# Patient Record
Sex: Female | Born: 1960 | Race: White | Hispanic: No | Smoking: Never smoker
Health system: Southern US, Community
[De-identification: ages and names within clinical notes are randomized; demographics above are authoritative.]

## PROBLEM LIST (undated history)

## (undated) HISTORY — PX: WISDOM TOOTH EXTRACTION: SHX21

## (undated) HISTORY — PX: OTHER SURGICAL HISTORY: SHX169

---

## 1998-09-14 ENCOUNTER — Encounter: Payer: Self-pay | Admitting: *Deleted

## 1998-09-14 ENCOUNTER — Ambulatory Visit (HOSPITAL_COMMUNITY): Admission: RE | Admit: 1998-09-14 | Discharge: 1998-09-14 | Payer: Self-pay | Admitting: *Deleted

## 1999-09-17 ENCOUNTER — Ambulatory Visit (HOSPITAL_BASED_OUTPATIENT_CLINIC_OR_DEPARTMENT_OTHER): Admission: RE | Admit: 1999-09-17 | Discharge: 1999-09-17 | Payer: Self-pay | Admitting: Orthopaedic Surgery

## 2001-02-22 ENCOUNTER — Inpatient Hospital Stay (HOSPITAL_COMMUNITY): Admission: AD | Admit: 2001-02-22 | Discharge: 2001-02-25 | Payer: Self-pay | Admitting: Obstetrics and Gynecology

## 2001-02-27 ENCOUNTER — Emergency Department (HOSPITAL_COMMUNITY): Admission: EM | Admit: 2001-02-27 | Discharge: 2001-02-27 | Payer: Self-pay | Admitting: Emergency Medicine

## 2001-03-24 ENCOUNTER — Other Ambulatory Visit: Admission: RE | Admit: 2001-03-24 | Discharge: 2001-03-24 | Payer: Self-pay | Admitting: Obstetrics and Gynecology

## 2002-08-12 ENCOUNTER — Ambulatory Visit (HOSPITAL_COMMUNITY): Admission: RE | Admit: 2002-08-12 | Discharge: 2002-08-12 | Payer: Self-pay | Admitting: Obstetrics and Gynecology

## 2002-08-12 ENCOUNTER — Encounter: Payer: Self-pay | Admitting: Obstetrics and Gynecology

## 2002-12-02 ENCOUNTER — Other Ambulatory Visit: Admission: RE | Admit: 2002-12-02 | Discharge: 2002-12-02 | Payer: Self-pay | Admitting: Obstetrics and Gynecology

## 2004-03-06 ENCOUNTER — Ambulatory Visit (HOSPITAL_COMMUNITY): Admission: RE | Admit: 2004-03-06 | Discharge: 2004-03-06 | Payer: Self-pay | Admitting: Obstetrics and Gynecology

## 2004-05-03 ENCOUNTER — Ambulatory Visit: Payer: Self-pay | Admitting: Family Medicine

## 2004-12-30 ENCOUNTER — Other Ambulatory Visit: Admission: RE | Admit: 2004-12-30 | Discharge: 2004-12-30 | Payer: Self-pay | Admitting: Obstetrics & Gynecology

## 2005-05-13 ENCOUNTER — Ambulatory Visit (HOSPITAL_COMMUNITY): Admission: RE | Admit: 2005-05-13 | Discharge: 2005-05-13 | Payer: Self-pay | Admitting: Obstetrics & Gynecology

## 2006-08-04 ENCOUNTER — Ambulatory Visit (HOSPITAL_COMMUNITY): Admission: RE | Admit: 2006-08-04 | Discharge: 2006-08-04 | Payer: Self-pay | Admitting: Obstetrics & Gynecology

## 2007-09-01 ENCOUNTER — Ambulatory Visit (HOSPITAL_COMMUNITY): Admission: RE | Admit: 2007-09-01 | Discharge: 2007-09-01 | Payer: Self-pay | Admitting: Obstetrics & Gynecology

## 2009-08-21 ENCOUNTER — Ambulatory Visit (HOSPITAL_COMMUNITY): Admission: RE | Admit: 2009-08-21 | Discharge: 2009-08-21 | Payer: Self-pay | Admitting: Obstetrics & Gynecology

## 2009-08-23 ENCOUNTER — Emergency Department (HOSPITAL_BASED_OUTPATIENT_CLINIC_OR_DEPARTMENT_OTHER): Admission: EM | Admit: 2009-08-23 | Discharge: 2009-08-23 | Payer: Self-pay | Admitting: Emergency Medicine

## 2010-03-02 ENCOUNTER — Encounter: Payer: Self-pay | Admitting: Family Medicine

## 2010-06-28 NOTE — Op Note (Signed)
[place]  Patient:    Amber Pennington, Amber Pennington                      MRN: [id number] Proc. Date: 09/17/99 Adm. Date:  [id number] Attending:  Marcene Corning                           Operative Report  PREOPERATIVE DIAGNOSIS:  Right foot degenerative arthritis.  POSTOPERATIVE DIAGNOSIS:  Right foot degenerative arthritis.  PROCEDURE:  Right foot cheilectomy.  ANESTHESIA:  Ankle block.  SURGEON:  Lubertha Basque. Jerl Santos, M.D.  ASSISTANT:  Prince Rome, P.A.  INDICATIONS FOR PROCEDURE:  The patient is a 50 year old woman with a long history of bilateral pain at the first MTP joint.  This was worse on the right.  On x-ray, she had dorsal spurring on the metatarsal head and the base of the proximal phalanx.  At this point, she is offered a cheilectomy as she failed conservative measures, oral anti-inflammatories, and orthotics.  The procedure was discussed with the patient and an informed operative consent was obtained after discussion of possible complications of reaction to anesthesia and infection.  DESCRIPTION OF PROCEDURE:  The patient was taken to the operating suite where ankle block anesthetic was applied without difficulty.  She was positioned supine, and prepped and draped in a normal sterile fashion.  The right leg was elevated, exsanguinated, and the tourniquet inflated up the calf.  A small dorsal incision was made with dissection down to the first MTP joint.  The extensor tendon was taken in a lateral direction and a capsulotomy was performed.  The spur on the dorsal aspect of the metatarsal head was removed with an oscillating saw, taking about a quarter of the articular surface.  The ________ on the base of the proximal phalanx.  Fluoroscopy was used to confirm adequate resection of the spur.  The articular cartilage actually appeared fairly benign.  The joint was thoroughly irrigated followed by reapproximation of the capsule incision  with Vicryl.  The skin was then reapproximated with nylon in vertical mattresses in an interrupted fashion. Adaptic was placed on the wound followed by a dry gauze and loose Ace wrap. I used fluoroscopy throughout the case to make appropriate intraoperative incisions ________.  DISPOSITION:  The patient was taken to the recovery room in stable condition. Plans were for her to go home the same day and follow up in the office in less than a week.  I will contact her by phone tonight. DD:  09/17/99 TD:  09/18/99 Job: [id number]

## 2015-01-25 ENCOUNTER — Encounter: Payer: Self-pay | Admitting: Obstetrics & Gynecology

## 2015-01-25 ENCOUNTER — Ambulatory Visit (INDEPENDENT_AMBULATORY_CARE_PROVIDER_SITE_OTHER): Payer: 59 | Admitting: Obstetrics & Gynecology

## 2015-01-25 VITALS — BP 117/63 | HR 75 | Resp 16 | Ht 69.0 in | Wt 158.0 lb

## 2015-01-25 DIAGNOSIS — Z01419 Encounter for gynecological examination (general) (routine) without abnormal findings: Secondary | ICD-10-CM

## 2015-01-25 DIAGNOSIS — Z124 Encounter for screening for malignant neoplasm of cervix: Secondary | ICD-10-CM

## 2015-01-25 DIAGNOSIS — Z1231 Encounter for screening mammogram for malignant neoplasm of breast: Secondary | ICD-10-CM

## 2015-01-25 DIAGNOSIS — Z1151 Encounter for screening for human papillomavirus (HPV): Secondary | ICD-10-CM | POA: Diagnosis not present

## 2015-01-25 NOTE — Patient Instructions (Signed)
Return to clinic for any scheduled appointments or for any gynecologic concerns as needed.   

## 2015-01-25 NOTE — Progress Notes (Signed)
GYNECOLOGY CLINIC ANNUAL PREVENTATIVE CARE ENCOUNTER NOTE  Subjective:   Amber Pennington is a 54 y.o. [phone] [id number] female here for a routine annual gynecologic exam.  Current complaints: none.  Has not seen a gynecologist in over five years.  Reports having a "bladder bulge", does not cause her any problems, does not want intervention.  Denies abnormal vaginal bleeding, discharge, pelvic pain, problems with intercourse or other gynecologic concerns.    Gynecologic History No LMP recorded. Patient is postmenopausal. Contraception: none, in same-sex relationship Last Pap: 5 years ago. Results were: normal Last mammogram: 5 years ago. Results were: normal  Obstetric History OB History  Gravida Para Term Preterm AB SAB TAB Ectopic Multiple Living  4 2 2  2 2    2     # Outcome Date GA Lbr Len/2nd Weight Sex Delivery Anes PTL Lv  4 SAB           3 SAB           2 Term           1 Term               History reviewed. No pertinent past medical history.  Past Surgical History  Procedure Laterality Date  . Bone spur Right   . Wisdom tooth extraction      No current outpatient prescriptions on file prior to visit.   No current facility-administered medications on file prior to visit.    No Known Allergies  Social History   Social History  . Marital Status: Unknown    Spouse Name: N/A  . Number of Children: N/A  . Years of Education: N/A   Occupational History  . valet    Social History Main Topics  . Smoking status: Never Smoker   . Smokeless tobacco: Never Used  . Alcohol Use: 0.0 oz/week    0 Standard drinks or equivalent per week     Comment: wine daily  . Drug Use: No  . Sexual Activity:    Partners: Female   Other Topics Concern  . Not on file   Social History Narrative    Family History  Problem Relation Age of Onset  . Hodgkin's lymphoma Mother   . Hypertension Father     The following portions of the patient's history were reviewed and updated as  appropriate: allergies, current medications, past family history, past medical history, past social history, past surgical history and problem list.  Review of Systems Pertinent items noted in HPI and remainder of comprehensive ROS otherwise negative.   Objective:  BP 117/63 mmHg  Pulse 75  Resp 16  Ht 5\' 9"  (1.753 m)  Wt 158 lb (71.668 kg)  BMI 23.32 kg/m2 CONSTITUTIONAL: Well-developed, well-nourished female in no acute distress.  HENT:  Normocephalic, atraumatic, External right and left ear normal. Oropharynx is clear and moist EYES: Conjunctivae and EOM are normal. Pupils are equal, round, and reactive to light. No scleral icterus.  NECK: Normal range of motion, supple, no masses.  Normal thyroid.  SKIN: Skin is warm and dry. No rash noted. Not diaphoretic. No erythema. No pallor. NEUROLGIC: Alert and oriented to person, place, and time. Normal reflexes, muscle tone coordination. No cranial nerve deficit noted. PSYCHIATRIC: Normal mood and affect. Normal behavior. Normal judgment and thought content. CARDIOVASCULAR: Normal heart rate noted, regular rhythm RESPIRATORY: Clear to auscultation bilaterally. Effort and breath sounds normal, no problems with respiration noted. BREASTS: Symmetric in size. No masses, skin changes, nipple drainage,  or lymphadenopathy. ABDOMEN: Soft, normal bowel sounds, no distention noted.  No tenderness, rebound or guarding.  PELVIC: Normal appearing external genitalia; normal appearing vaginal mucosa and cervix. Stage 1 cystocele noted, goes to Stage 2 with Valsalva.  No abnormal discharge noted.  Pap smear obtained.  Normal uterine size, no other palpable masses, no uterine or adnexal tenderness. MUSCULOSKELETAL: Normal range of motion. No tenderness.  No cyanosis, clubbing, or edema.  2+ distal pulses.   Assessment:  Annual gynecologic examination with pap smear   Plan:  Will follow up results of pap smear and manage accordingly.  Mammogram  scheduled Routine preventative health maintenance measures emphasized. Please refer to After Visit Summary for other counseling recommendations.    Jaynie Collins, MD, FACOG Attending Obstetrician & Gynecologist, [facility] for [facility]

## 2015-01-29 LAB — CYTOLOGY - PAP

## 2015-09-06 ENCOUNTER — Encounter: Payer: Self-pay | Admitting: Radiology

## 2015-09-06 ENCOUNTER — Other Ambulatory Visit: Payer: Self-pay | Admitting: Occupational Medicine

## 2015-09-06 ENCOUNTER — Ambulatory Visit: Payer: Self-pay

## 2015-09-06 DIAGNOSIS — M79671 Pain in right foot: Secondary | ICD-10-CM

## 2015-09-06 DIAGNOSIS — M25571 Pain in right ankle and joints of right foot: Secondary | ICD-10-CM

## 2016-11-26 IMAGING — CR DG FOOT COMPLETE 3+V*R*
3 series · 3 of 3 positions shown · non-contrast
Comparison: None.

CLINICAL DATA: Twisted foot and ankle 08/28/2015. Persistent
lateral pain.

EXAM:
RIGHT FOOT COMPLETE - 3+ VIEW

[view not recorded (1 of 3)]
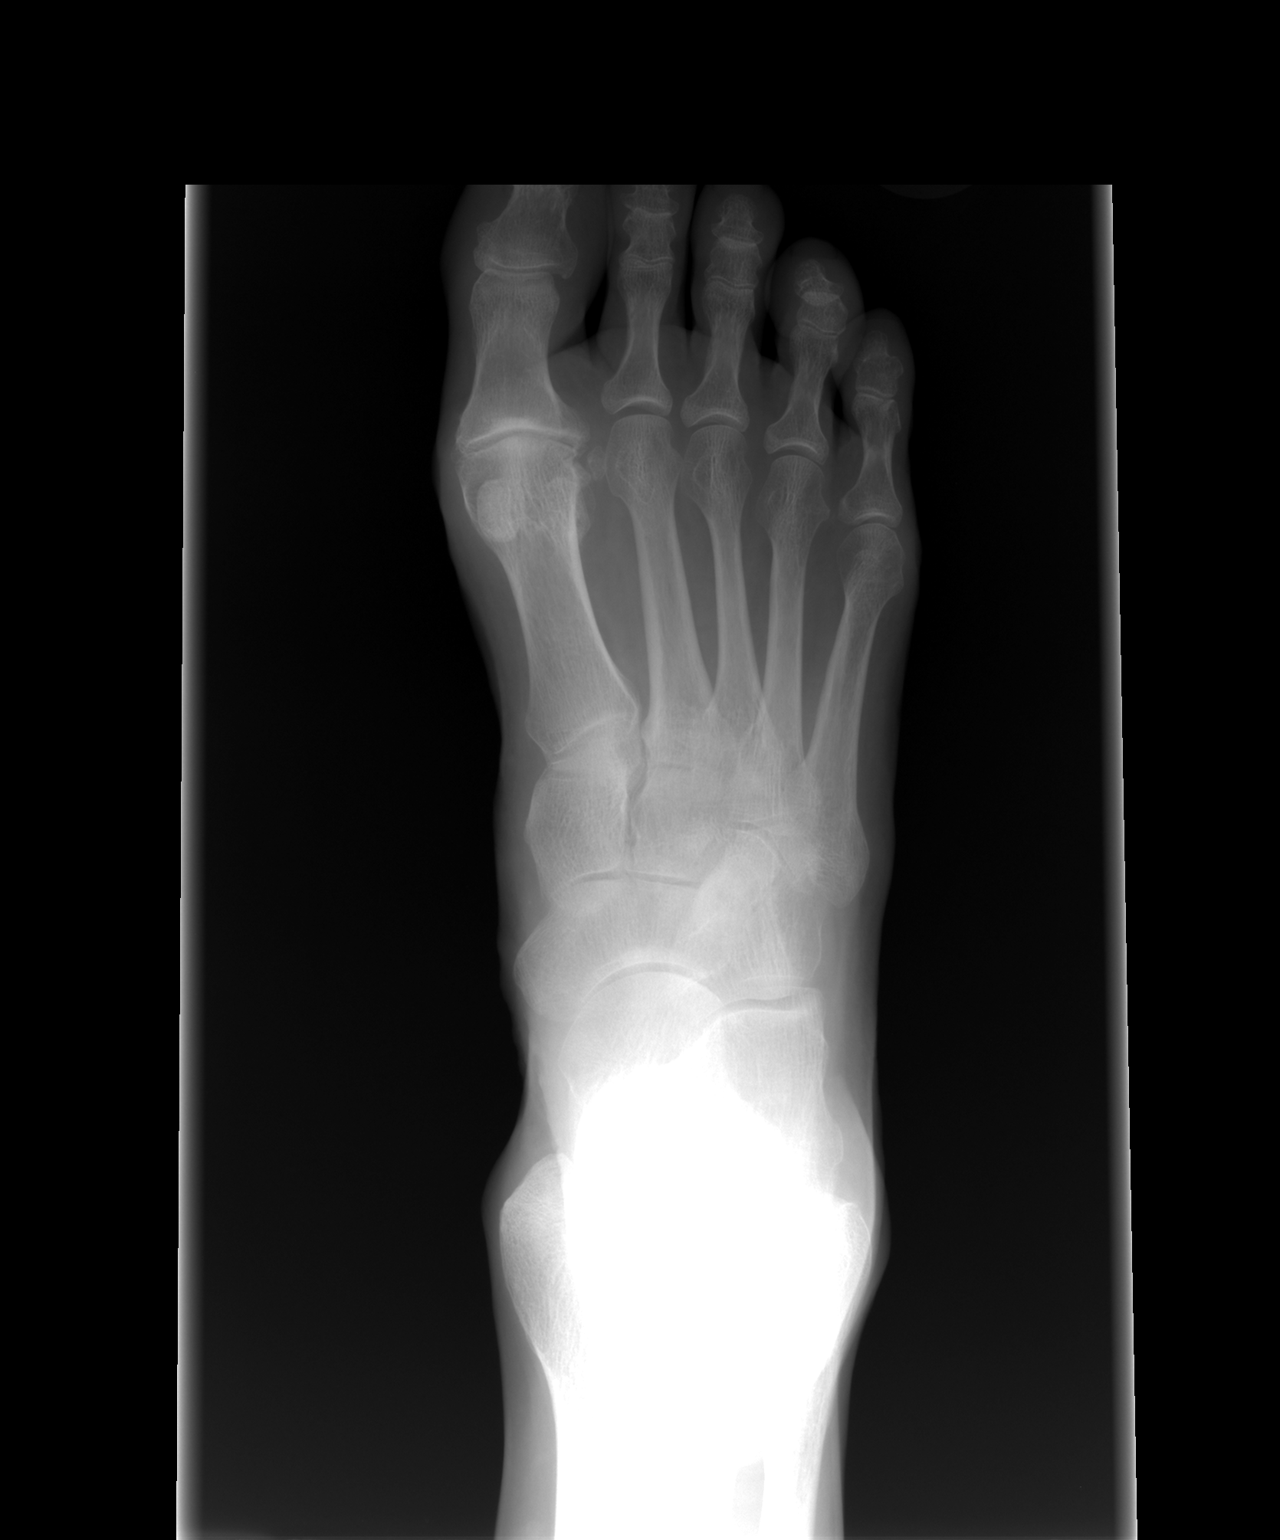

[view not recorded (2 of 3)]
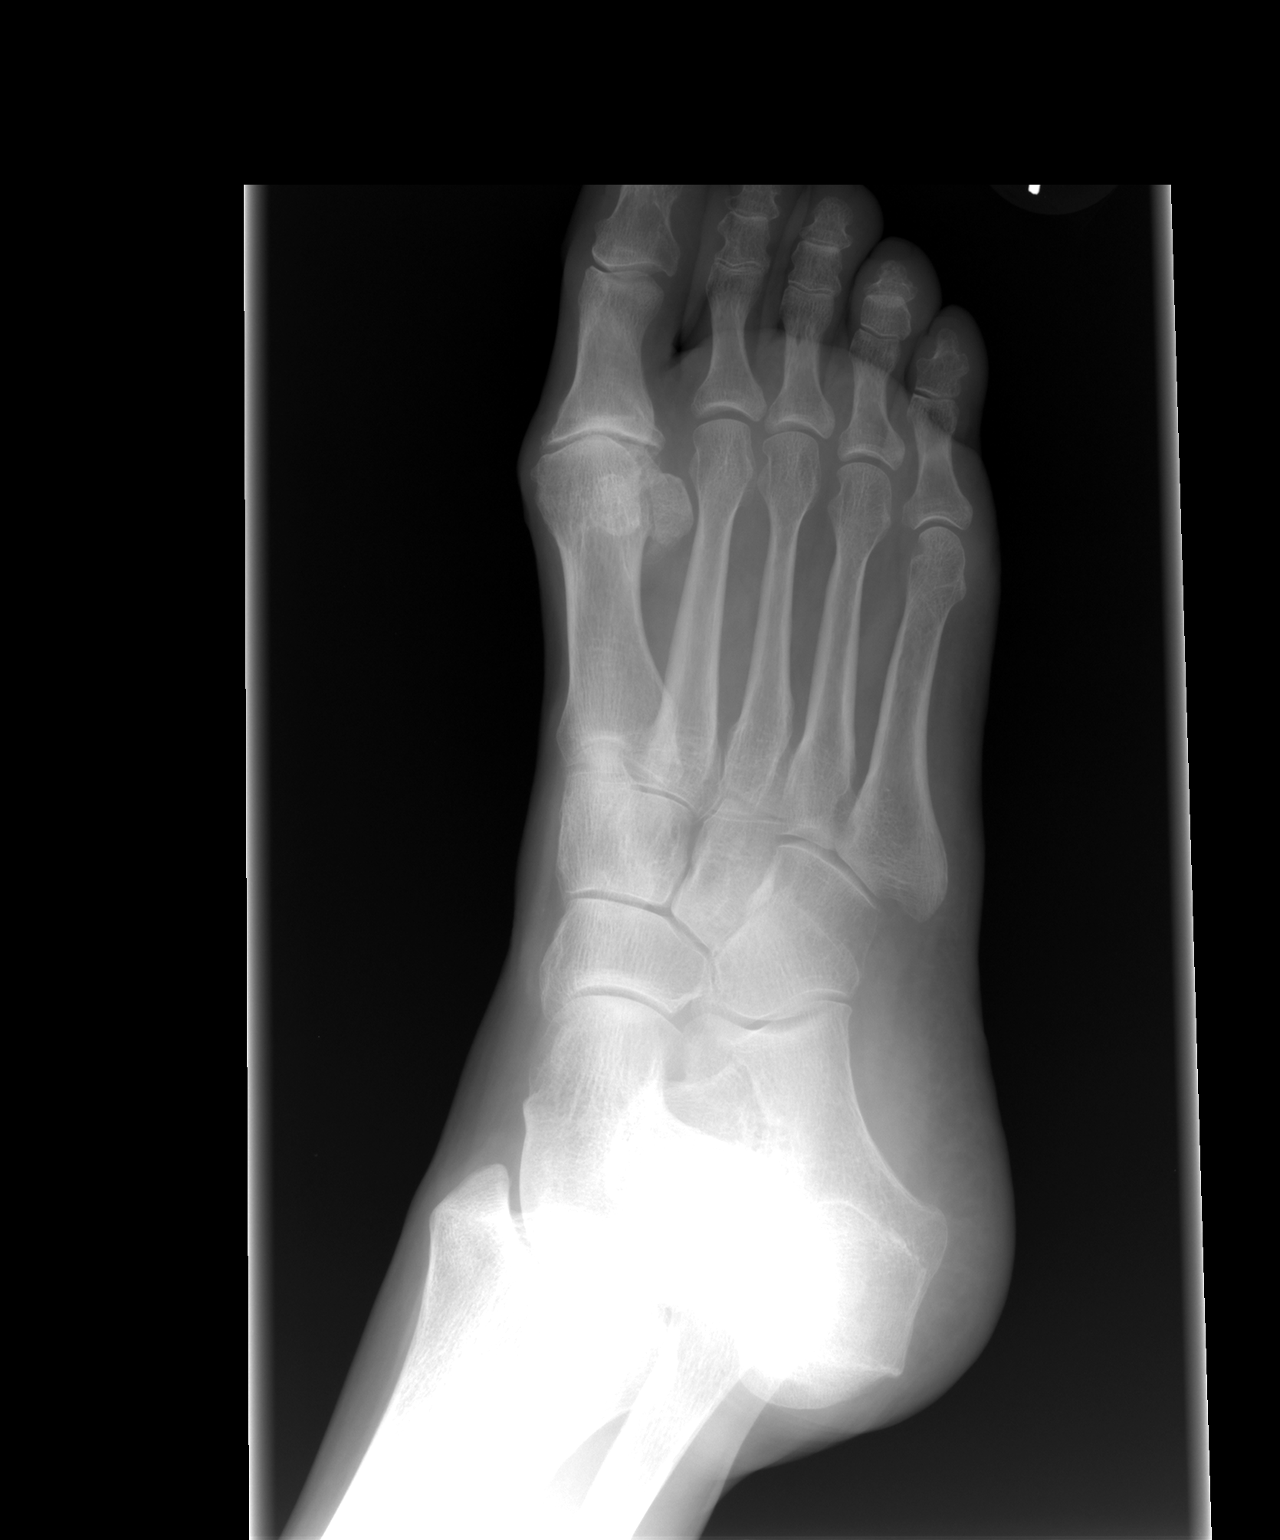

[view not recorded (3 of 3)]
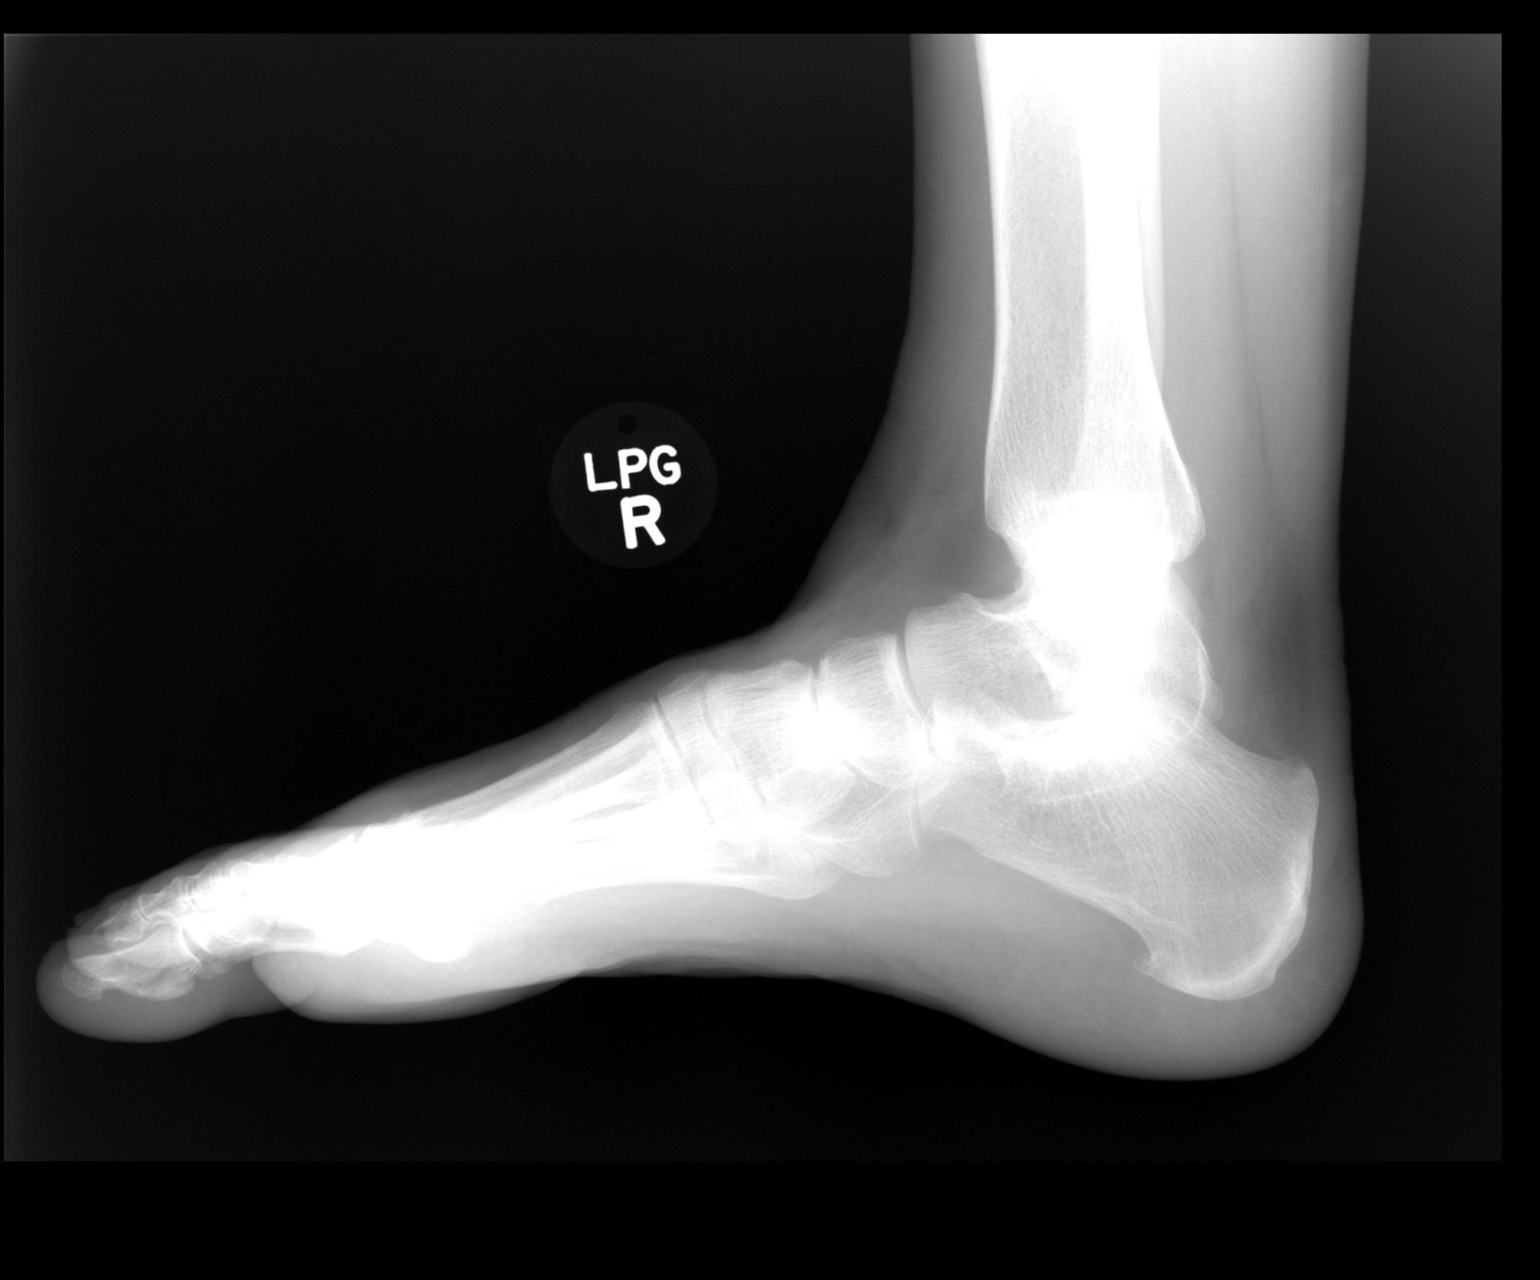

[3 of 3 positions shown; findings below may reference images not displayed]

FINDINGS: There is no evidence of fracture or dislocation. There is no
evidence of arthropathy or other focal bone abnormality. Soft
tissues are unremarkable.
IMPRESSION: Negative.

## 2021-01-29 ENCOUNTER — Emergency Department (HOSPITAL_COMMUNITY)
Admission: EM | Admit: 2021-01-29 | Discharge: 2021-01-30 | Disposition: A | Discharge status: Home / Self Care | Payer: Self-pay | Attending: Emergency Medicine | Admitting: Emergency Medicine

## 2021-01-29 ENCOUNTER — Other Ambulatory Visit: Payer: Self-pay

## 2021-01-29 ENCOUNTER — Emergency Department (HOSPITAL_COMMUNITY): Payer: Self-pay

## 2021-01-29 ENCOUNTER — Encounter (HOSPITAL_BASED_OUTPATIENT_CLINIC_OR_DEPARTMENT_OTHER): Payer: Self-pay

## 2021-01-29 ENCOUNTER — Emergency Department (HOSPITAL_BASED_OUTPATIENT_CLINIC_OR_DEPARTMENT_OTHER)
Admission: EM | Admit: 2021-01-29 | Discharge: 2021-01-29 | Disposition: A | Discharge status: Home / Self Care | Payer: Self-pay | Attending: Emergency Medicine | Admitting: Emergency Medicine

## 2021-01-29 ENCOUNTER — Encounter (HOSPITAL_COMMUNITY): Payer: Self-pay

## 2021-01-29 DIAGNOSIS — N939 Abnormal uterine and vaginal bleeding, unspecified: Secondary | ICD-10-CM | POA: Insufficient documentation

## 2021-01-29 DIAGNOSIS — N950 Postmenopausal bleeding: Secondary | ICD-10-CM

## 2021-01-29 LAB — COMPREHENSIVE METABOLIC PANEL
ALT: 20 U/L (ref 0–44)
AST: 26 U/L (ref 15–41)
Albumin: 4.2 g/dL (ref 3.5–5.0)
Alkaline Phosphatase: 55 U/L (ref 38–126)
Anion gap: 7 (ref 5–15)
BUN: 12 mg/dL (ref 6–20)
CO2: 28 mmol/L (ref 22–32)
Calcium: 9 mg/dL (ref 8.9–10.3)
Chloride: 100 mmol/L (ref 98–111)
Creatinine, Ser: 0.58 mg/dL (ref 0.44–1.00)
GFR, Estimated: >60 mL/min (ref >60)
Glucose, Bld: 107 mg/dL — ABNORMAL HIGH (ref 70–99)
Potassium: 3.5 mmol/L (ref 3.5–5.1)
Sodium: 135 mmol/L (ref 135–145)
Total Bilirubin: 0.4 mg/dL (ref 0.3–1.2)
Total Protein: 7.3 g/dL (ref 6.5–8.1)

## 2021-01-29 LAB — CBC WITH DIFFERENTIAL/PLATELET
Abs Immature Granulocytes: 0 10*3/uL (ref 0.00–0.07)
Abs Immature Granulocytes: 0.02 10*3/uL (ref 0.00–0.07)
Basophils Absolute: 0 10*3/uL (ref 0.0–0.1)
Basophils Absolute: 0 10*3/uL (ref 0.0–0.1)
Basophils Relative: 1 %
Basophils Relative: 0 %
Eosinophils Absolute: 0.2 10*3/uL (ref 0.0–0.5)
Eosinophils Absolute: 0.1 10*3/uL (ref 0.0–0.5)
Eosinophils Relative: 4 %
Eosinophils Relative: 1 %
HCT: 41.1 % (ref 36.0–46.0)
HCT: 35.7 % — ABNORMAL LOW (ref 36.0–46.0)
Hemoglobin: 14 g/dL (ref 12.0–15.0)
Hemoglobin: 12 g/dL (ref 12.0–15.0)
Immature Granulocytes: 0 %
Immature Granulocytes: 0 %
Lymphocytes Relative: 42 %
Lymphocytes Relative: 42 %
Lymphs Abs: 2.6 10*3/uL (ref 0.7–4.0)
Lymphs Abs: 2.9 10*3/uL (ref 0.7–4.0)
MCH: 30.4 pg (ref 26.0–34.0)
MCH: 30 pg (ref 26.0–34.0)
MCHC: 34.1 g/dL (ref 30.0–36.0)
MCHC: 33.6 g/dL (ref 30.0–36.0)
MCV: 89.2 fL (ref 80.0–100.0)
MCV: 89.3 fL (ref 80.0–100.0)
Monocytes Absolute: 0.6 10*3/uL (ref 0.1–1.0)
Monocytes Absolute: 0.5 10*3/uL (ref 0.1–1.0)
Monocytes Relative: 10 %
Monocytes Relative: 6 %
Neutro Abs: 2.6 10*3/uL (ref 1.7–7.7)
Neutro Abs: 3.5 10*3/uL (ref 1.7–7.7)
Neutrophils Relative %: 43 %
Neutrophils Relative %: 51 %
Platelets: 335 10*3/uL (ref 150–400)
Platelets: 333 10*3/uL (ref 150–400)
RBC: 4.61 MIL/uL (ref 3.87–5.11)
RBC: 4 MIL/uL (ref 3.87–5.11)
RDW: 12.4 % (ref 11.5–15.5)
RDW: 12.3 % (ref 11.5–15.5)
WBC: 6 10*3/uL (ref 4.0–10.5)
WBC: 7 10*3/uL (ref 4.0–10.5)
nRBC: 0 % (ref 0.0–0.2)
nRBC: 0 % (ref 0.0–0.2)

## 2021-01-29 LAB — BASIC METABOLIC PANEL
Anion gap: 6 (ref 5–15)
BUN: 11 mg/dL (ref 6–20)
CO2: 25 mmol/L (ref 22–32)
Calcium: 8.6 mg/dL — ABNORMAL LOW (ref 8.9–10.3)
Chloride: 103 mmol/L (ref 98–111)
Creatinine, Ser: 0.84 mg/dL (ref 0.44–1.00)
GFR, Estimated: >60 mL/min (ref >60)
Glucose, Bld: 187 mg/dL — ABNORMAL HIGH (ref 70–99)
Potassium: 3.3 mmol/L — ABNORMAL LOW (ref 3.5–5.1)
Sodium: 134 mmol/L — ABNORMAL LOW (ref 135–145)

## 2021-01-29 MED ORDER — LACTATED RINGERS IV BOLUS
1000.0000 mL | Freq: Once | INTRAVENOUS | Status: AC
  Administered 2021-01-30: 1000 mL via INTRAVENOUS

## 2021-01-29 NOTE — ED Triage Notes (Signed)
Pt has been having heavy post menopausal bleeding since earlier today with clots. Pt complains of weakness.

## 2021-01-29 NOTE — Consult Note (Signed)
° °  OB/GYN Telephone Consult  12/20 @ 245AM  Amber Pennington is a 60 y.o. PM female presented due to vaginal bleeding. I was called for a consult regarding the care of this patient by [facility]                                                          .    The provider had a clinical question about vaginal bleeding  The provider presented the following relevant clinical information:   Pt reported that her vaginal bleeding had just started this afternoon and notes continued bleeding since that time.  Patient's VSS are stable, Hgb is appropriate; however due to the early morning hour [place] is not available at their stand along facility.  Exam was attempted; however due to the blood visualization was challenging.  Physician was asking for next best step- hoping that either medication or an appointment could be made in the office for her first thing next business day.  I performed a chart review on the patient and reviewed available documentation. -Patient was last seen 2016 by Dr. Macon Large for annual exam -2021 seen by Urogyn where pessary was increased in size  BP 124/81 (BP Location: Right Arm)    Pulse 90    Temp 97.6 F (36.4 C) (Oral)    Resp 18    Ht 5\' 9"  (1.753 m)    Wt 68.9 kg    SpO2 100%    BMI 22.45 kg/m   Exam- performed by consulting provider   Recommendations:  -Due to acute onset of bleeding with known pessary did not feel medication would be warranted without at first noting source of bleeding -reviewed that bleeding could be cause from erosion/irritation from pessary vs postmenopausal bleeding from uterus -urgent message sent to [facility] Admin pool to call this am to schedule same day appointment  -Also recommended MD/APP provide the patient with a referral to the Center for [facility] (any office) for follow up at the next available  Thank you for this consult and if additional recommendations are needed please call [phone] for the OB/GYN attending on  service at [facility].   I spent approximately 10 minutes directly consulting with the provider and verbally discussing this case. Additionally 10 minutes minutes was spent performing chart review and documentation.   [facility], DO Attending Obstetrician & Gynecologist, [facility] for [facility]

## 2021-01-29 NOTE — ED Provider Notes (Signed)
[facility]   CSN: [id number] Arrival date & time: 01/29/21  2024     History Chief Complaint  Patient presents with   Vaginal Bleeding    Amber ComaDeborah A Dettloff is a 60 y.o. female who presents with 2 days of heavy vaginal bleeding.  Patient is postmenopausal with last menstrual period 11 years ago by her report.  She states that yesterday afternoon around 3 PM she started bleeding vaginally and continued to bleed for the next 12 hours.  States she is going through 3-4 overnight pads per hour.  Was seen in the emergency department at [facility] last night with reassuring labs without anemia.  Unfortunately there was no ultrasonographer last [facility] therefore patient was supposed to have outpatient pelvic ultrasound today though this appears to have fallen through.  She states that from 3 AM until 6 PM today she does not have any vaginal bleeding but at 6 PM it started again with the same heaviness as well yesterday.  She denies any abdominal pain, fevers, chills, or history of uterine abnormality.  She denies any recent weight changes.  She does endorse significant anxiety in the last 24 hours after googling possible etiologies of her symptoms.  [facility] where she is part of the charity care program given she does not have medical insurance.  Has followed with their OB/GYN in the past for management of a pessary for bladder prolapse.  She is on daily baby aspirin electively due to family history and personal history of varicose veins.  Does not have any other known medical diagnoses.  HPI     History reviewed. No pertinent past medical history.  Patient Active Problem List   Diagnosis Date Noted   PMB (postmenopausal bleeding) 01/30/2021    Past Surgical History:  Procedure Laterality Date   bone spur Right    WISDOM TOOTH EXTRACTION       OB History     Gravida  4   Para  2   Term  2   Preterm   0   AB  2   Living         SAB  2   IAB  0   Ectopic  0   Multiple      Live Births              Family History  Problem Relation Age of Onset   Hodgkin's lymphoma Mother    Hypertension Father     Social History   Tobacco Use   Smoking status: Never    Passive exposure: Never   Smokeless tobacco: Never  Vaping Use   Vaping Use: Never used  Substance Use Topics   Alcohol use: Yes    Alcohol/week: 0.0 standard drinks    Comment: wine daily   Drug use: No    Home Medications Prior to Admission medications   Not on File    Allergies    Patient has no known allergies.  Review of Systems   Review of Systems  Constitutional: Negative.   HENT: Negative.    Respiratory: Negative.    Cardiovascular: Negative.   Gastrointestinal: Negative.   Genitourinary:  Positive for vaginal bleeding. Negative for decreased urine volume, difficulty urinating, dyspareunia, dysuria, flank pain, frequency, genital sores, hematuria, menstrual problem, pelvic pain, urgency and vaginal pain.  Musculoskeletal: Negative.   Skin: Negative.   Neurological:  Positive for light-headedness. Negative for dizziness, tremors,  syncope, facial asymmetry, weakness, numbness and headaches.  Hematological: Negative.    Physical Exam Updated Vital Signs BP 120/79    Pulse 78    Temp (!) 97.5 F (36.4 C)    Resp 17    Ht 5\' 9"  (1.753 m)    Wt 68.9 kg    SpO2 99%    BMI 22.45 kg/m   Physical Exam Vitals and nursing note reviewed. Exam conducted with a chaperone present.  Constitutional:      Appearance: She is not ill-appearing or toxic-appearing.  HENT:     Head: Normocephalic and atraumatic.     Nose: Nose normal. No congestion.     Mouth/Throat:     Mouth: Mucous membranes are moist.     Pharynx: No oropharyngeal exudate or posterior oropharyngeal erythema.  Eyes:     General:        Right eye: No discharge.        Left eye: No discharge.     Extraocular Movements:  Extraocular movements intact.     Conjunctiva/sclera: Conjunctivae normal.     Pupils: Pupils are equal, round, and reactive to light.  Cardiovascular:     Rate and Rhythm: Normal rate and regular rhythm.     Pulses: Normal pulses.     Heart sounds: Normal heart sounds. No murmur heard. Pulmonary:     Effort: Pulmonary effort is normal. No respiratory distress.     Breath sounds: Normal breath sounds. No wheezing or rales.  Abdominal:     General: Bowel sounds are normal. There is no distension.     Palpations: Abdomen is soft.     Tenderness: There is no abdominal tenderness. There is no right CVA tenderness, left CVA tenderness, guarding or rebound.     Hernia: No hernia is present.  Genitourinary:    General: Normal vulva.     Exam position: Lithotomy position.     Vagina: Bleeding present.     Cervix: No cervical motion tenderness.     Uterus: Normal.      Adnexa: Right adnexa normal and left adnexa normal.     Rectum: No external hemorrhoid.     Comments: Copious bleeding in the vaginal vault with several large clots up to 8 cm in length.  Visualization of the cervix complicated due to quantity of bleeding.  No CMT, no adnexal fullness or tenderness palpation.  No visualized external or internal lacerations. Musculoskeletal:        General: No deformity.     Cervical back: Neck supple.     Right lower leg: No edema.     Left lower leg: No edema.  Lymphadenopathy:     Cervical: No cervical adenopathy.     Lower Body: No right inguinal adenopathy. No left inguinal adenopathy.  Skin:    General: Skin is warm and dry.     Capillary Refill: Capillary refill takes less than 2 seconds.  Neurological:     General: No focal deficit present.     Mental Status: She is alert and oriented to person, place, and time. Mental status is at baseline.  Psychiatric:        Mood and Affect: Mood normal.    ED Results / Procedures / Treatments   Labs (all labs ordered are listed, but only  abnormal results are displayed) Labs Reviewed  CBC WITH DIFFERENTIAL/PLATELET - Abnormal; Notable for the following components:      Result Value   HCT 35.7 (*)  All other components within normal limits  BASIC METABOLIC PANEL - Abnormal; Notable for the following components:   Sodium 134 (*)    Potassium 3.3 (*)    Glucose, Bld 187 (*)    Calcium 8.6 (*)    All other components within normal limits  SURGICAL PATHOLOGY    EKG None  Radiology US Transvaginal Non-OB  Result Date: 01/29/2021 CLINICAL DATA:  Postmenopausal bleeding EXAM: TRANSABDOMINAL AND TRANSVAGINAL ULTRASOUND OF PELVIS DOPPLER ULTRASOUND OF OVARIES TECHNIQUE: Both transabdominal and transvaginal ultrasound examinations of the pelvis were performed. Transabdominal technique was performed for global imaging of the pelvis including uterus, ovaries, adnexal regions, and pelvic cul-de-sac. It was necessary to proceed with endovaginal exam following the transabdominal exam to visualize the endometriuml. Color and duplex Doppler ultrasound was utilized to evaluate blood flow to the ovaries. COMPARISON:  None. FINDINGS: Uterus Measurements: 6.0 x 2.7 x 4.3 cm = volume: 36 mL. The uterus is anteverted. The cervix is unremarkable. A heterogeneously hypoechoic mass is seen within the fundus measuring roughly 1.8 x 1.9 cm, best seen on sagittal image # 34 most in keeping with a intramural fibroid. No other intrauterine masses are seen. Endometrium Thickness: Less than 1 mm.  No focal abnormality visualized. Right ovary Measurements: 0.8 x 0.6 x 0.7 cm = volume: 1 mL. Normal appearance/no adnexal mass. Left ovary Measurements: 2.3 x 1.6 x 2.0 cm = volume: 4 mL. Heterogeneous echotexture noted, nonspecific. No discrete cystic or solid mass identified. Pulsed Doppler evaluation of both ovaries demonstrates normal low-resistance arterial and venous waveforms. Other findings No abnormal free fluid. IMPRESSION: Endometrial atrophy. 1.9 cm  intramural fundal mass likely representing an intramural fibroid. Electronically Signed   By: Fidela Salisbury M.D.   On: 01/29/2021 22:26   US Pelvis Complete  Result Date: 01/29/2021 CLINICAL DATA:  Postmenopausal bleeding EXAM: TRANSABDOMINAL AND TRANSVAGINAL ULTRASOUND OF PELVIS DOPPLER ULTRASOUND OF OVARIES TECHNIQUE: Both transabdominal and transvaginal ultrasound examinations of the pelvis were performed. Transabdominal technique was performed for global imaging of the pelvis including uterus, ovaries, adnexal regions, and pelvic cul-de-sac. It was necessary to proceed with endovaginal exam following the transabdominal exam to visualize the endometriuml. Color and duplex Doppler ultrasound was utilized to evaluate blood flow to the ovaries. COMPARISON:  None. FINDINGS: Uterus Measurements: 6.0 x 2.7 x 4.3 cm = volume: 36 mL. The uterus is anteverted. The cervix is unremarkable. A heterogeneously hypoechoic mass is seen within the fundus measuring roughly 1.8 x 1.9 cm, best seen on sagittal image # 34 most in keeping with a intramural fibroid. No other intrauterine masses are seen. Endometrium Thickness: Less than 1 mm.  No focal abnormality visualized. Right ovary Measurements: 0.8 x 0.6 x 0.7 cm = volume: 1 mL. Normal appearance/no adnexal mass. Left ovary Measurements: 2.3 x 1.6 x 2.0 cm = volume: 4 mL. Heterogeneous echotexture noted, nonspecific. No discrete cystic or solid mass identified. Pulsed Doppler evaluation of both ovaries demonstrates normal low-resistance arterial and venous waveforms. Other findings No abnormal free fluid. IMPRESSION: Endometrial atrophy. 1.9 cm intramural fundal mass likely representing an intramural fibroid. Electronically Signed   By: Fidela Salisbury M.D.   On: 01/29/2021 22:26   [place] Art/Ven Flow Abd Pelv Doppler  Result Date: 01/29/2021 CLINICAL DATA:  Postmenopausal bleeding EXAM: TRANSABDOMINAL AND TRANSVAGINAL ULTRASOUND OF PELVIS DOPPLER ULTRASOUND OF OVARIES  TECHNIQUE: Both transabdominal and transvaginal ultrasound examinations of the pelvis were performed. Transabdominal technique was performed for global imaging of the pelvis including uterus, ovaries, adnexal regions, and  pelvic cul-de-sac. It was necessary to proceed with endovaginal exam following the transabdominal exam to visualize the endometriuml. Color and duplex Doppler ultrasound was utilized to evaluate blood flow to the ovaries. COMPARISON:  None. FINDINGS: Uterus Measurements: 6.0 x 2.7 x 4.3 cm = volume: 36 mL. The uterus is anteverted. The cervix is unremarkable. A heterogeneously hypoechoic mass is seen within the fundus measuring roughly 1.8 x 1.9 cm, best seen on sagittal image # 34 most in keeping with a intramural fibroid. No other intrauterine masses are seen. Endometrium Thickness: Less than 1 mm.  No focal abnormality visualized. Right ovary Measurements: 0.8 x 0.6 x 0.7 cm = volume: 1 mL. Normal appearance/no adnexal mass. Left ovary Measurements: 2.3 x 1.6 x 2.0 cm = volume: 4 mL. Heterogeneous echotexture noted, nonspecific. No discrete cystic or solid mass identified. Pulsed Doppler evaluation of both ovaries demonstrates normal low-resistance arterial and venous waveforms. Other findings No abnormal free fluid. IMPRESSION: Endometrial atrophy. 1.9 cm intramural fundal mass likely representing an intramural fibroid. Electronically Signed   By: Fidela Salisbury M.D.   On: 01/29/2021 22:26    Procedures Procedures   Medications Ordered in ED Medications  lactated ringers bolus 1,000 mL (1,000 mLs Intravenous New Bag/Given 01/30/21 0022)    ED Course  I have reviewed the triage vital signs and the nursing notes.  Pertinent labs & imaging results that were available during my care of the patient were reviewed by me and considered in my medical decision making (see chart for details).  Clinical Course as of 01/30/21 0221  Wed Jan 30, 2021  0142 Consult to gynecology, Dr. Harolyn Rutherford,  who recommends Lysteda (TXA) 1300 mg p.o. 3 times daily x5 days as well as naproxen 500 twice daily x7 days for management of endometrial atrophy.  States that this is not typically responsive to hormonal therapy.  Does recommend patient follow-up closely in the outpatient setting and present to [facility] emergency department with any persistent profuse bleeding or complicating concern so that OB/GYN may be consulted in person.  I appreciate her collaboration in the care of this patient. [RS]    Clinical Course User Index [RS] Treveon Bourcier, Sharlene Dory   MDM Rules/Calculators/A&P                         60 year old female presents with 2 days of postmenopausal vaginal bleeding.  Differential diagnosis includes but is not limited to Abnormal uterine bleeding, polyps, adenomyosis, leiomyoma, malignancy, uterine fibroids, coagulopathy, atrophic endometrium, endometriosis.  Tachycardic on intake, however to my exam heart rate is 78 while resting.  The patient was quite anxious when she arrived in the emergency department but has not had any chest pain, shortness of breath, or tachycardia at home.  Pulmonary exam is normal, cardiac exam is benign.  Abdominal exam is benign.  GU exam with copious bleeding and expulsion of large clots as above no CMT or adnexal changes.  Patient is neurovascular intact in all 4 extremities.  CBC with hemoglobin of 12, decreased from 14 yesterday.  BMP with hyponatremia of 134, hypokalemia of 3.3.  Pelvic ultrasounds with Doppler revealed endometrial atrophy and 1.9 cm intramural fundal mass likely intramural fibroid. Consult to gynecology pending.   Consult to gynecology as above, patient likely bleeding secondary to her endometrial atrophy.  Will discharge with TXA and naproxen per gynecology and have patient follow-up closely in the outpatient office.  No further work-up warranted near this time.  Patient  feeling improved after administration of LR bolus.  Shevy  voiced understanding of her medical evaluation and treatment plan thus far.  Each of her questions was answered to her expressed satisfaction.  Return precautions were given.  Patient is well-appearing, stable, and appropriate for discharge at this time.  This chart was dictated using voice recognition software, Dragon. Despite the best efforts of this provider to proofread and correct errors, errors may still occur which can change documentation meaning.  Final Clinical Impression(s) / ED Diagnoses Final diagnoses:  None    Rx / DC Orders ED Discharge Orders     None        Aura Dials 01/30/21 0221    Fatima Blank, MD 01/30/21 [phone]

## 2021-01-29 NOTE — ED Provider Notes (Signed)
Emergency Medicine Provider Triage Evaluation Note  Amber Pennington , a 60 y.o. female  was evaluated in triage.  Pt complains of .  Heavy vaginal bleeding.  She was seen last night.  She has had significant vaginal bleeding starting yesterday at 4:00 PM.  She has been passing heavy clots.  She is soaking 3 or 4 overnight pads an hour.  Patient was seen by Dr. Gardiner Rhyme last night.  She had a vaginal exam last night which showed clots in the vagina and adherent to the cervix.  She had some lab work that was and was within normal limits.  Patient was supposed to have outpatient ultrasound however this appeared to have fallen through.  She came in today because she is feeling like she is going to pass out and is feeling lightheaded and continues to bleed heavily.  Patient does not have outpatient follow-up due to lack of insurance  Review of Systems  Positive: Vaginal bleeding Negative: Urinary sxs  Physical Exam  BP (!) 145/106    Pulse (!) 121    Resp 18    Ht 5\' 9"  (1.753 m)    Wt 68.9 kg    SpO2 100%    BMI 22.45 kg/m  Gen:   Awake, no distress   Resp:  Normal effort  MSK:   Moves extremities without difficulty  Other:  tachycardic  Medical Decision Making  Medically screening exam initiated at 8:52 PM.  Appropriate orders placed.  was informed that the remainder of the evaluation will be completed by another provider, this initial triage assessment does not replace that evaluation, and the importance of remaining in the ED until their evaluation is complete.  Work up initiated.   Dessie Coma, PA-C 01/29/21 2103    2104, MD 01/30/21 [phone]

## 2021-01-29 NOTE — Discharge Instructions (Signed)
Follow-up with gynecology later today. Call EMS if you pass out, feel extremely weak or worsening symptoms before you get an appointment.  Stay well-hydrated with water.

## 2021-01-29 NOTE — ED Provider Notes (Signed)
[facility] EMERGENCY DEPARTMENT Provider Note   CSN: [id number] Arrival date & time: 01/29/21  0049     History Chief Complaint  Patient presents with   Vaginal Bleeding    Amber Pennington is a 60 y.o. female.  Patient presents with vaginal bleeding with clots since 4:00 this afternoon.  Patient's had a change pad every 30 minutes to 1 hour.  Feels intermittent lightheadedness no syncope.  No history of similar.  Postmenopausal since age 49.  Patient has not had regular Pap smears since losing her insurance.  Patient's follow-up with specialist for pessary and bladder wall weakness since her vaginal birth.  No fevers or chills.  No shortness of breath or chest pain.  No blood thinners.      History reviewed. No pertinent past medical history.  There are no problems to display for this patient.   Past Surgical History:  Procedure Laterality Date   bone spur Right    WISDOM TOOTH EXTRACTION       OB History     Gravida  4   Para  2   Term  2   Preterm  0   AB  2   Living         SAB  2   IAB  0   Ectopic  0   Multiple      Live Births              Family History  Problem Relation Age of Onset   Hodgkin's lymphoma Mother    Hypertension Father     Social History   Tobacco Use   Smoking status: Never    Passive exposure: Never   Smokeless tobacco: Never  Vaping Use   Vaping Use: Never used  Substance Use Topics   Alcohol use: Yes    Alcohol/week: 0.0 standard drinks    Comment: wine daily   Drug use: No    Home Medications Prior to Admission medications   Not on File    Allergies    Patient has no known allergies.  Review of Systems   Review of Systems  Constitutional:  Negative for chills and fever.  HENT:  Negative for congestion.   Eyes:  Negative for visual disturbance.  Respiratory:  Negative for shortness of breath.   Cardiovascular:  Negative for chest pain.  Gastrointestinal:  Negative for abdominal  pain and vomiting.  Genitourinary:  Positive for vaginal bleeding. Negative for dysuria and flank pain.  Musculoskeletal:  Negative for back pain, neck pain and neck stiffness.  Skin:  Negative for rash.  Neurological:  Negative for light-headedness and headaches.   Physical Exam Updated Vital Signs BP 124/81 (BP Location: Right Arm)    Pulse 90    Temp 97.6 F (36.4 C) (Oral)    Resp 18    Ht 5\' 9"  (1.753 m)    Wt 68.9 kg    SpO2 100%    BMI 22.45 kg/m   Physical Exam Vitals and nursing note reviewed.  Constitutional:      General: She is not in acute distress.    Appearance: She is well-developed.  HENT:     Head: Normocephalic and atraumatic.     Mouth/Throat:     Mouth: Mucous membranes are moist.  Eyes:     General:        Right eye: No discharge.        Left eye: No discharge.     Conjunctiva/sclera: Conjunctivae  normal.  Neck:     Trachea: No tracheal deviation.  Cardiovascular:     Rate and Rhythm: Normal rate.  Pulmonary:     Effort: Pulmonary effort is normal.  Abdominal:     General: There is no distension.     Palpations: Abdomen is soft.     Tenderness: There is no abdominal tenderness. There is no guarding.  Genitourinary:    Comments: Patient has mild persistent bleeding with a few clots on vaginal/cervix examination.  Nontender cervix.  No external lacerations appreciated. Musculoskeletal:     Cervical back: Normal range of motion. No rigidity.  Skin:    General: Skin is warm.     Capillary Refill: Capillary refill takes less than 2 seconds.     Findings: No rash.  Neurological:     General: No focal deficit present.     Mental Status: She is alert.     Cranial Nerves: No cranial nerve deficit.  Psychiatric:        Mood and Affect: Mood normal.    ED Results / Procedures / Treatments   Labs (all labs ordered are listed, but only abnormal results are displayed) Labs Reviewed  COMPREHENSIVE METABOLIC PANEL - Abnormal; Notable for the following  components:      Result Value   Glucose, Bld 107 (*)    All other components within normal limits  CBC WITH DIFFERENTIAL/PLATELET    EKG None  Radiology No results found.  Procedures Procedures   Medications Ordered in ED Medications - No data to display  ED Course  I have reviewed the triage vital signs and the nursing notes.  Pertinent labs & imaging results that were available during my care of the patient were reviewed by me and considered in my medical decision making (see chart for details).    MDM Rules/Calculators/A&P                          Patient presents with vaginal bleeding postmenopausal.  Discussed differential diagnosis with the patient she is very anxious about the possibility of cancer.  Discussed there are other differentials are not cancerous as well.  Blood work ordered and reviewed normal hemoglobin, normal platelets, normal electrolytes.  Vital signs normal and no current symptoms of significant anemia.  Unable to obtain ultrasound as no technician overnight. Discussed the case with provider on-call who was extremely helpful and will arrange follow-up for patient later today.  Patient is comfortable with this and will use pads until she is seen.  We will hold on medications at this time.     Final Clinical Impression(s) / ED Diagnoses Final diagnoses:  Vaginal bleeding    Rx / DC Orders ED Discharge Orders     None        Blane Ohara, MD 01/29/21 [phone]

## 2021-01-29 NOTE — ED Triage Notes (Signed)
Patient presents with complaint of vaginal bleeding with clots since about 1600 this afternoon.  Patient states she has to change a pad every 30 minutes to 1hr.  Feels generalized weakness overall.  Patient is normally postmenopausal.

## 2021-01-30 DIAGNOSIS — N950 Postmenopausal bleeding: Secondary | ICD-10-CM

## 2021-01-30 MED ORDER — NAPROXEN 500 MG PO TABS: 500.0000 mg | ORAL_TABLET | Freq: Two times a day (BID) | ORAL | 0 refills | Status: DC

## 2021-01-30 MED ORDER — TRANEXAMIC ACID 650 MG PO TABS: 1300.0000 mg | ORAL_TABLET | Freq: Three times a day (TID) | ORAL | 0 refills | Status: AC

## 2021-01-30 MED ORDER — NAPROXEN 500 MG PO TABS: 500.0000 mg | ORAL_TABLET | Freq: Two times a day (BID) | ORAL | 0 refills | Status: AC

## 2021-01-30 NOTE — Discharge Instructions (Addendum)
You were seen in the ER today for your vaginal bleeding.  Diagnosed with endometrial atrophy.  To manage this you have been prescribed 2 medications.  The first is called Lysteda, tranexamic acid which you should take 2 tablets (1300 mg) 3 times per day for the next 5 days.  I have given you a printed prescription and a coupon for this medication at [facility] as this is $20 cheaper than if it were to be prescribed to [facility].  Please present to the [facility] with this coupon in hand for the cheapest price, or you may use the printed prescription at [facility] if that is your preferred pharmacy.  Additionally you have been prescribed naproxen 500 mg to take twice per day for the next week.  If this is more expensive as a prescription you may pick it up over-the-counter as an alternative.  Please follow-up with the OB/GYN listed below or your previously established [facility] provider and return to the ER with any persistent heavy vaginal bleeding, lightheadedness, if you pass out, develop any difficulty breathing, or any other new severe symptom.

## 2021-01-30 NOTE — Consult Note (Signed)
OB/GYN Telephone Consult Follow up Note  12/21 @ 2150AM  Amber Pennington is a 60 y.o. female presenting to [facility] Emergency Room due to vaginal bleeding. I was called for a consult regarding the management of this patient by Paris Lore ,PA-C. Of note, we were called for a consult for this patient yesterday also, Dr. Charlotta Newton who recommended that patient be seen urgently in office.  The provider presented the following relevant clinical information:  Patient continues to have heavy bleeding since yesterday, feels a little weaker. Stable vital signs but hemoglobin went from 14 to 12. She had an ultrasound today that showed endometrial atrophy, endometrium thickness is less than 1 mm.   BP 120/79    Pulse 78    Temp (!) 97.5 F (36.4 C)    Resp 17    Ht 5\' 9"  (1.753 m)    Wt 68.9 kg    SpO2 99%    BMI 22.45 kg/m   Exam- performed by consulting provider and revelaled moderate ongoing bleeding, no cervical mass seen (last pap smear was negative with negative HRHPV on 01/25/2015)  01/27/2015 Transvaginal Non-OB  Result Date: 01/29/2021 CLINICAL DATA:  Postmenopausal bleeding EXAM: TRANSABDOMINAL AND TRANSVAGINAL ULTRASOUND OF PELVIS DOPPLER ULTRASOUND OF OVARIES TECHNIQUE: Both transabdominal and transvaginal ultrasound examinations of the pelvis were performed. Transabdominal technique was performed for global imaging of the pelvis including uterus, ovaries, adnexal regions, and pelvic cul-de-sac. It was necessary to proceed with endovaginal exam following the transabdominal exam to visualize the endometriuml. Color and duplex Doppler ultrasound was utilized to evaluate blood flow to the ovaries. COMPARISON:  None. FINDINGS: Uterus Measurements: 6.0 x 2.7 x 4.3 cm = volume: 36 mL. The uterus is anteverted. The cervix is unremarkable. A heterogeneously hypoechoic mass is seen within the fundus measuring roughly 1.8 x 1.9 cm, best seen on sagittal image # 34 most in keeping with a intramural fibroid. No  other intrauterine masses are seen. Endometrium Thickness: Less than 1 mm.  No focal abnormality visualized. Right ovary Measurements: 0.8 x 0.6 x 0.7 cm = volume: 1 mL. Normal appearance/no adnexal mass. Left ovary Measurements: 2.3 x 1.6 x 2.0 cm = volume: 4 mL. Heterogeneous echotexture noted, nonspecific. No discrete cystic or solid mass identified. Pulsed Doppler evaluation of both ovaries demonstrates normal low-resistance arterial and venous waveforms. Other findings No abnormal free fluid. IMPRESSION: Endometrial atrophy. 1.9 cm intramural fundal mass likely representing an intramural fibroid. Electronically Signed   By: 01/31/2021 M.D.   On: 01/29/2021 22:26   01/31/2021 Pelvis Complete  Result Date: 01/29/2021 CLINICAL DATA:  Postmenopausal bleeding EXAM: TRANSABDOMINAL AND TRANSVAGINAL ULTRASOUND OF PELVIS DOPPLER ULTRASOUND OF OVARIES TECHNIQUE: Both transabdominal and transvaginal ultrasound examinations of the pelvis were performed. Transabdominal technique was performed for global imaging of the pelvis including uterus, ovaries, adnexal regions, and pelvic cul-de-sac. It was necessary to proceed with endovaginal exam following the transabdominal exam to visualize the endometriuml. Color and duplex Doppler ultrasound was utilized to evaluate blood flow to the ovaries. COMPARISON:  None. FINDINGS: Uterus Measurements: 6.0 x 2.7 x 4.3 cm = volume: 36 mL. The uterus is anteverted. The cervix is unremarkable. A heterogeneously hypoechoic mass is seen within the fundus measuring roughly 1.8 x 1.9 cm, best seen on sagittal image # 34 most in keeping with a intramural fibroid. No other intrauterine masses are seen. Endometrium Thickness: Less than 1 mm.  No focal abnormality visualized. Right ovary Measurements: 0.8 x 0.6 x 0.7 cm = volume:  1 mL. Normal appearance/no adnexal mass. Left ovary Measurements: 2.3 x 1.6 x 2.0 cm = volume: 4 mL. Heterogeneous echotexture noted, nonspecific. No discrete cystic or  solid mass identified. Pulsed Doppler evaluation of both ovaries demonstrates normal low-resistance arterial and venous waveforms. Other findings No abnormal free fluid. IMPRESSION: Endometrial atrophy. 1.9 cm intramural fundal mass likely representing an intramural fibroid. Electronically Signed   By: Helyn Numbers M.D.   On: 01/29/2021 22:26   [place] Art/Ven Flow Abd Pelv Doppler  Result Date: 01/29/2021 CLINICAL DATA:  Postmenopausal bleeding EXAM: TRANSABDOMINAL AND TRANSVAGINAL ULTRASOUND OF PELVIS DOPPLER ULTRASOUND OF OVARIES TECHNIQUE: Both transabdominal and transvaginal ultrasound examinations of the pelvis were performed. Transabdominal technique was performed for global imaging of the pelvis including uterus, ovaries, adnexal regions, and pelvic cul-de-sac. It was necessary to proceed with endovaginal exam following the transabdominal exam to visualize the endometriuml. Color and duplex Doppler ultrasound was utilized to evaluate blood flow to the ovaries. COMPARISON:  None. FINDINGS: Uterus Measurements: 6.0 x 2.7 x 4.3 cm = volume: 36 mL. The uterus is anteverted. The cervix is unremarkable. A heterogeneously hypoechoic mass is seen within the fundus measuring roughly 1.8 x 1.9 cm, best seen on sagittal image # 34 most in keeping with a intramural fibroid. No other intrauterine masses are seen. Endometrium Thickness: Less than 1 mm.  No focal abnormality visualized. Right ovary Measurements: 0.8 x 0.6 x 0.7 cm = volume: 1 mL. Normal appearance/no adnexal mass. Left ovary Measurements: 2.3 x 1.6 x 2.0 cm = volume: 4 mL. Heterogeneous echotexture noted, nonspecific. No discrete cystic or solid mass identified. Pulsed Doppler evaluation of both ovaries demonstrates normal low-resistance arterial and venous waveforms. Other findings No abnormal free fluid. IMPRESSION: Endometrial atrophy. 1.9 cm intramural fundal mass likely representing an intramural fibroid. Electronically Signed   By: Helyn Numbers  M.D.   On: 01/29/2021 22:26     Recommendations:  -Due to ongoing bleeding, recommend trial of tranexamic (TXA) 1300 mg po tid x 5 days, and Naproxen 500 mg po bid x 7 days. - If there is no relief within the next day or 2, may consider short term oral estrogen therapy to help with denuded endometrium. The recommended dosage is 2.5 mg qid until the bleeding subsides. -Urgent message re-sent to [facility] Admin pool to call this am to schedule same day appointment as recommended by Dr. Charlotta Newton -Patient will be urged to come to [facility] ER if symptoms worsen.  Thank you for this consult and if additional recommendations are needed please call [phone] for the OB/GYN attending on service at [facility] [place] and [facility].  I spent approximately 10 minutes directly consulting with the provider and verbally discussing this case. Additionally 10 minutes minutes was spent performing chart review and documentation.    Jaynie Collins, MD Attending Obstetrician & Gynecologist, [facility] for [facility], [facility]

## 2021-01-31 ENCOUNTER — Other Ambulatory Visit: Payer: Self-pay

## 2021-01-31 ENCOUNTER — Other Ambulatory Visit (HOSPITAL_COMMUNITY)
Admission: RE | Admit: 2021-01-31 | Discharge: 2021-01-31 | Disposition: A | Discharge status: Home / Self Care | Payer: Self-pay | Source: Ambulatory Visit | Attending: Obstetrics and Gynecology | Admitting: Obstetrics and Gynecology

## 2021-01-31 ENCOUNTER — Ambulatory Visit (INDEPENDENT_AMBULATORY_CARE_PROVIDER_SITE_OTHER): Payer: Self-pay | Admitting: Obstetrics and Gynecology

## 2021-01-31 ENCOUNTER — Encounter: Payer: Self-pay | Admitting: Obstetrics and Gynecology

## 2021-01-31 VITALS — BP 147/86 | HR 89 | Wt 158.0 lb

## 2021-01-31 DIAGNOSIS — N950 Postmenopausal bleeding: Secondary | ICD-10-CM | POA: Insufficient documentation

## 2021-01-31 HISTORY — PX: ENDOMETRIAL BIOPSY: PRO73

## 2021-01-31 LAB — SURGICAL PATHOLOGY

## 2021-01-31 MED ORDER — MEDROXYPROGESTERONE ACETATE 10 MG PO TABS: ORAL_TABLET | ORAL | 0 refills | Status: AC

## 2021-01-31 NOTE — Procedures (Signed)
Endometrial Biopsy Procedure Note  Pre-operative Diagnosis: PMB  Post-operative Diagnosis: same  Procedure Details  The risks (including infection, bleeding, pain, and uterine perforation) and benefits of the procedure were explained to the patient and Written informed consent was obtained.  The patient was placed in the dorsal lithotomy position.  Bimanual exam showed the uterus to be in the neutral position.  A Graves' speculum inserted in the vagina, and the cervix was visualized and a pap smear performed. The cervix was then prepped with povidone iodine, and a sharp tenaculum was applied to the anterior lip of the cervix for stabilization.  A pipelle was inserted into the uterine cavity and sounded the uterus to a depth of 6cm.  A Minimal amount of tissue was collected after 2 passes. The sample was sent for pathologic examination.  Condition: Stable  Complications: None  Plan: The patient was advised to call for any fever or for prolonged or severe pain or bleeding. She was advised to use OTC analgesics as needed for mild to moderate pain. She was advised to avoid vaginal intercourse for 48 hours or until the bleeding has completely stopped.  Cornelia Copa MD Attending Center for [employer])

## 2021-01-31 NOTE — Progress Notes (Signed)
Obstetrics and Gynecology New Patient Evaluation  Appointment Date: 01/31/2021  OBGYN Clinic: [facility]  Primary Care Provider: Pcp, No  Chief Complaint: ED follow up for post menopausal bleeding  History of Present Illness: Amber Pennington is a 60 y.o. G4P2 (LMP: age 34), seen for the above chief complaint.   Patient went to [facility] ED on 12/20 for heavy vaginal bleeding and had negative cbc and cmp and was to have GYN follow up set up; no u/s available so none done but a large clot was sent to surg path (results pending)  She back later that same day but to a different ED in order to get an u/s for the PMB.   Bleeding stopped after leaving the ED, most recently, and before she started the [facility] which she was able to get through a coupon. No current gyn s/s.  She states she never had pain with the bleeding.   Review of Systems: Pertinent items noted in HPI and remainder of comprehensive ROS otherwise negative.    Patient Active Problem List   Diagnosis Date Noted   PMB (postmenopausal bleeding) 01/30/2021    Past Medical History:  No past medical history on file.  Past Surgical History:  Past Surgical History:  Procedure Laterality Date   bone spur Right    WISDOM TOOTH EXTRACTION      Past Obstetrical History:  OB History  Gravida Para Term Preterm AB Living  4 2 2  0 2    SAB IAB Ectopic Multiple Live Births  2 0 0        # Outcome Date GA Lbr Len/2nd Weight Sex Delivery Anes PTL Lv  4 SAB           3 SAB           2 Term           1 Term             Obstetric Comments  Vaginal delivery x 2    Past Gynecological History: As per HPI. History of Pap Smear(s): Yes.   Last pap approx 10 years ago, results unknown History of HRT use: No.  Social History:  Social History   Socioeconomic History   Marital status: Divorced    Spouse name: Not on file   Number of children: Not on file   Years of education: Not on  file   Highest education level: Not on file  Occupational History   Occupation: valet  Tobacco Use   Smoking status: Never    Passive exposure: Never   Smokeless tobacco: Never  Vaping Use   Vaping Use: Never used  Substance and Sexual Activity   Alcohol use: Yes    Alcohol/week: 0.0 standard drinks    Comment: wine daily   Drug use: No   Sexual activity: Yes    Partners: Female  Other Topics Concern   Not on file  Social History Narrative   ** Merged History Encounter **       Social Determinants of Health   Financial Resource Strain: Not on file  Food Insecurity: Not on file  Transportation Needs: Not on file  Physical Activity: Not on file  Stress: Not on file  Social Connections: Not on file  Intimate Partner Violence: Not on file    Family History:  Family History  Problem Relation Age of Onset   Hodgkin's lymphoma Mother    Hypertension Father    She denies  any female cancers,  Medications Dessie Coma had no medications administered during this visit. Current Outpatient Medications  Medication Sig Dispense Refill   naproxen (NAPROSYN) 500 MG tablet Take 1 tablet (500 mg total) by mouth 2 (two) times daily for 7 days. 14 tablet 0   tranexamic acid (LYSTEDA) 650 MG TABS tablet Take 2 tablets (1,300 mg total) by mouth 3 (three) times daily for 5 days. 30 tablet 0   No current facility-administered medications for this visit.    Allergies Patient has no known allergies.   Physical Exam:  BP (!) 147/86    Pulse 89    Wt 158 lb (71.7 kg)    BMI 23.33 kg/m  Body mass index is 23.33 kg/m. General appearance: Well nourished, well developed female in no acute distress.  Neck:  Supple, normal appearance, and no thyromegaly  Cardiovascular: normal s1 and s2.  No murmurs, rubs or gallops. Respiratory:  Clear to auscultation bilateral. Normal respiratory effort Abdomen: positive bowel sounds and no masses, hernias; diffusely non tender to palpation, non  distended Neuro/Psych:  Normal mood and affect.  Skin:  Warm and dry.  Lymphatic:  No inguinal lymphadenopathy.   Pelvic exam: is not limited by body habitus EGBUS: within normal limits Vagina: within normal limits and with no blood or discharge in the vault Cervix: normal appearing cervix without tenderness, discharge or lesions. Uterus:  nonenlarged and non tender Adnexa:  normal adnexa and no mass, fullness, tenderness Rectovaginal: deferred  Laboratory:  CMP Latest Ref Rng & Units 01/29/2021 01/29/2021  Glucose 70 - 99 mg/dL 161(W) 960(A)  BUN 6 - 20 mg/dL 11 12  Creatinine 5.40 - 1.00 mg/dL 9.81 1.91  Sodium 478 - 145 mmol/L 134(L) 135  Potassium 3.5 - 5.1 mmol/L 3.3(L) 3.5  Chloride 98 - 111 mmol/L 103 100  CO2 22 - 32 mmol/L 25 28  Calcium 8.9 - 10.3 mg/dL 2.9(F) 9.0  Total Protein 6.5 - 8.1 g/dL - 7.3  Total Bilirubin 0.3 - 1.2 mg/dL - 0.4  Alkaline Phos 38 - 126 U/L - 55  AST 15 - 41 U/L - 26  ALT 0 - 44 U/L - 20   CBC Latest Ref Rng & Units 01/29/2021 01/29/2021  WBC 4.0 - 10.5 K/uL 7.0 6.0  Hemoglobin 12.0 - 15.0 g/dL 62.1 30.8  Hematocrit 65.7 - 46.0 % 35.7(L) 41.1  Platelets 150 - 400 K/uL 333 335    Radiology:  Narrative & Impression  CLINICAL DATA:  Postmenopausal bleeding   EXAM: TRANSABDOMINAL AND TRANSVAGINAL ULTRASOUND OF PELVIS   DOPPLER ULTRASOUND OF OVARIES   TECHNIQUE: Both transabdominal and transvaginal ultrasound examinations of the pelvis were performed. Transabdominal technique was performed for global imaging of the pelvis including uterus, ovaries, adnexal regions, and pelvic cul-de-sac.   It was necessary to proceed with endovaginal exam following the transabdominal exam to visualize the endometriuml. Color and duplex Doppler ultrasound was utilized to evaluate blood flow to the ovaries.   COMPARISON:  None.   FINDINGS: Uterus   Measurements: 6.0 x 2.7 x 4.3 cm = volume: 36 mL. The uterus is anteverted. The cervix is  unremarkable. A heterogeneously hypoechoic mass is seen within the fundus measuring roughly 1.8 x 1.9 cm, best seen on sagittal image # 34 most in keeping with a intramural fibroid. No other intrauterine masses are seen.   Endometrium   Thickness: Less than 1 mm.  No focal abnormality visualized.   Right ovary   Measurements: 0.8 x 0.6 x  0.7 cm = volume: 1 mL. Normal appearance/no adnexal mass.   Left ovary   Measurements: 2.3 x 1.6 x 2.0 cm = volume: 4 mL. Heterogeneous echotexture noted, nonspecific. No discrete cystic or solid mass identified.   Pulsed Doppler evaluation of both ovaries demonstrates normal low-resistance arterial and venous waveforms.   Other findings   No abnormal free fluid.   IMPRESSION: Endometrial atrophy.   1.9 cm intramural fundal mass likely representing an intramural fibroid.     Electronically Signed   By: Fidela Salisbury M.D.   On: 01/29/2021 22:26    Assessment: pt stable  Plan:  1. Postmenopausal bleeding Reassuring that endometrial stripe is so thin and barely anything on embx today. F/u path results.  Patient is self pay.   Follow up blood clot path sent from ED visit  Provera sent in in case heavy bleeding ever comes back.   - Surgical pathology( [place]/ [facility]) - Cytology - PAP( [place])  2. Primary Care Recommend contacting [place]. See above. Pt states she has charity care through [facility] and will try them for this and mammogram if available to her.  Request sent to [facility] to see about screening mammograms   RTC PRN  Durene Romans MD Attending Center for [facility])

## 2021-02-05 LAB — SURGICAL PATHOLOGY

## 2021-02-06 ENCOUNTER — Telehealth: Payer: Self-pay | Admitting: *Deleted

## 2021-02-06 LAB — CYTOLOGY - PAP
Chlamydia: NEGATIVE
Comment: NEGATIVE
Comment: NEGATIVE
Comment: NEGATIVE
Comment: NORMAL
Diagnosis: NEGATIVE
High risk HPV: NEGATIVE
Neisseria Gonorrhea: NEGATIVE
Trichomonas: NEGATIVE

## 2021-02-06 NOTE — Telephone Encounter (Addendum)
-----   Message from Harlem Bing, MD sent at 02/06/2021 10:33 AM EST ----- Can you let her know that we are still waiting on her pap results but her biopsy came back negative and reassuring. Thanks  12/28  1040  Called pt and left message on her personal VM stating Endometrial biopsy result is negative an reassuring. Pap results are not yet completed. We will call her once those results have been received.

## 2021-02-15 ENCOUNTER — Telehealth: Payer: Self-pay

## 2021-02-15 NOTE — Telephone Encounter (Signed)
Telephoned patient at home number, busy signal. [facility] unable to leave a message.

## 2021-02-20 ENCOUNTER — Telehealth: Payer: Self-pay | Admitting: Lactation Services

## 2021-02-20 NOTE — Telephone Encounter (Signed)
Called patient on mobile phone number listed. She did not answer. LM that calling with non urgent test results and to call the office at [phone] at her earliest convenience for results and recommendations.

## 2021-02-20 NOTE — Telephone Encounter (Signed)
-----   Message from Barrett Bing, MD sent at 02/19/2021 12:18 PM EST ----- Can you let her know that her biopsy and pap all came back negative so no further follow up needed but if she ever has any more bleeding or spotting to please contact the office for a follow up visit? Also, let her know that she'll need a routine pap in five years.   thanks

## 2021-02-21 NOTE — Telephone Encounter (Signed)
Called patient to give results. Patient voiced understanding to results and recommendations.   She reports she has been doing well since she was in for her appointment.

## 2022-04-21 IMAGING — US US PELVIS COMPLETE
1 series · 15 of 25 positions shown · non-contrast
Comparison: None.

CLINICAL DATA: Postmenopausal bleeding

EXAM:
TRANSABDOMINAL AND TRANSVAGINAL ULTRASOUND OF PELVIS
DOPPLER ULTRASOUND OF OVARIES
TECHNIQUE: Both transabdominal and transvaginal ultrasound examinations of the
pelvis were performed. Transabdominal technique was performed for
global imaging of the pelvis including uterus, ovaries, adnexal
regions, and pelvic cul-de-sac.
It was necessary to proceed with endovaginal exam following the
transabdominal exam to visualize the endometriuml. Color and duplex
Doppler ultrasound was utilized to evaluate blood flow to the
ovaries.

[Series 1: us pelvis complete mc & wl · 64 acquisitions, 15 frames shown]
[im 1/64]
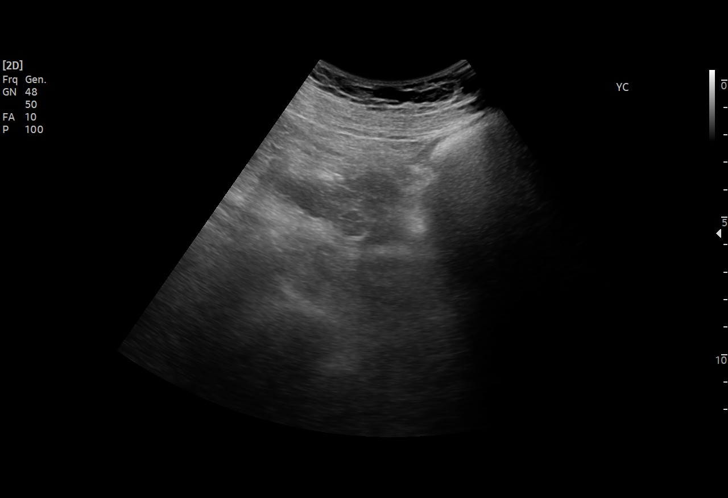
[im 6/64]
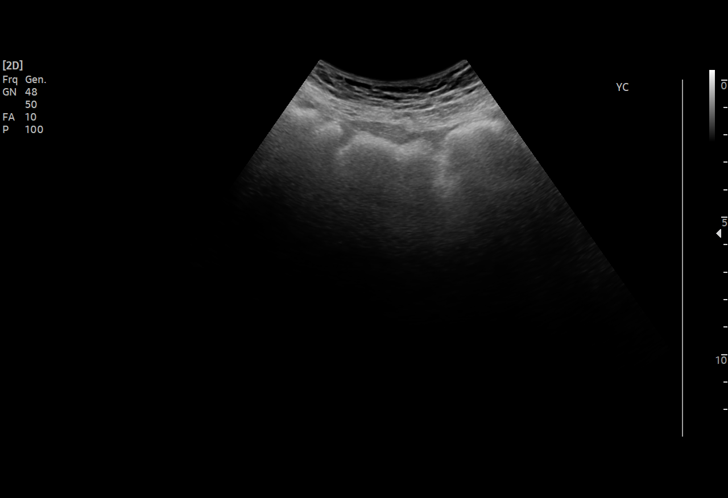
[im 11/64]
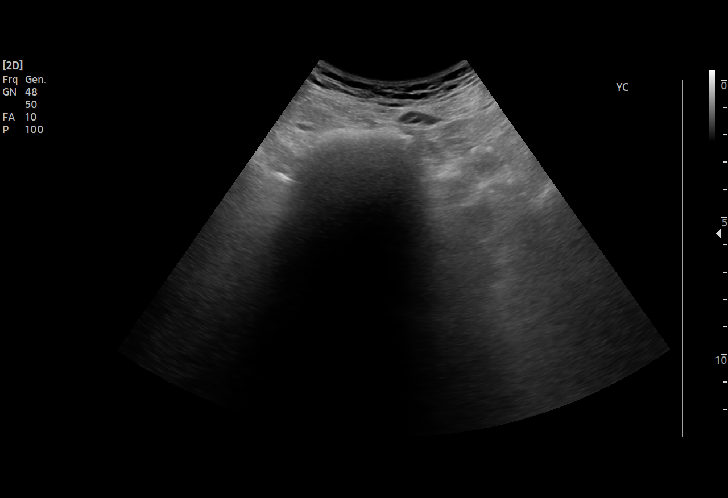
[im 14/64]
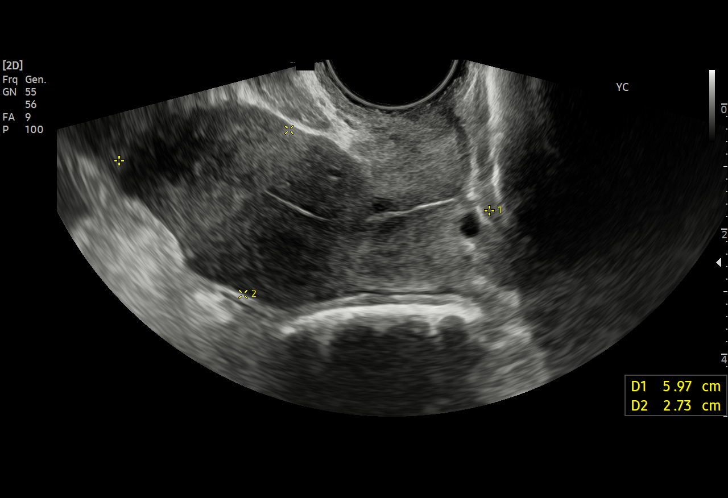
[im 19/64]
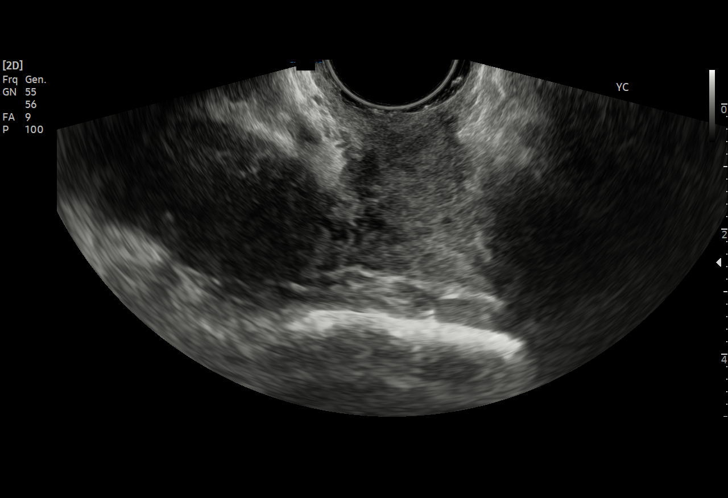
[im 24/64]
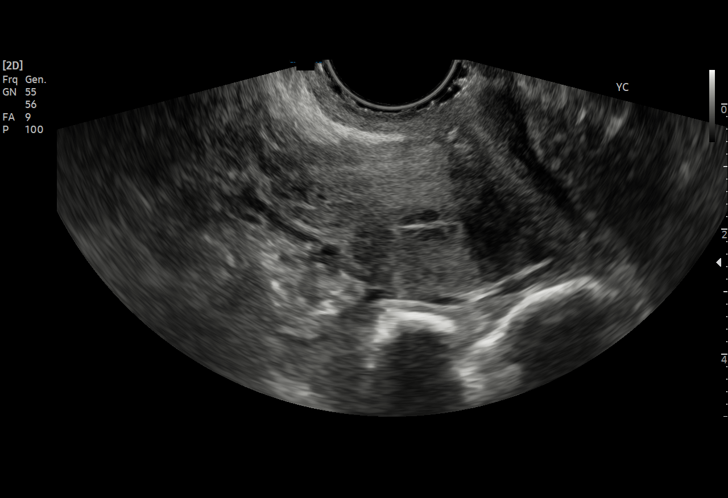
[im 27/64]
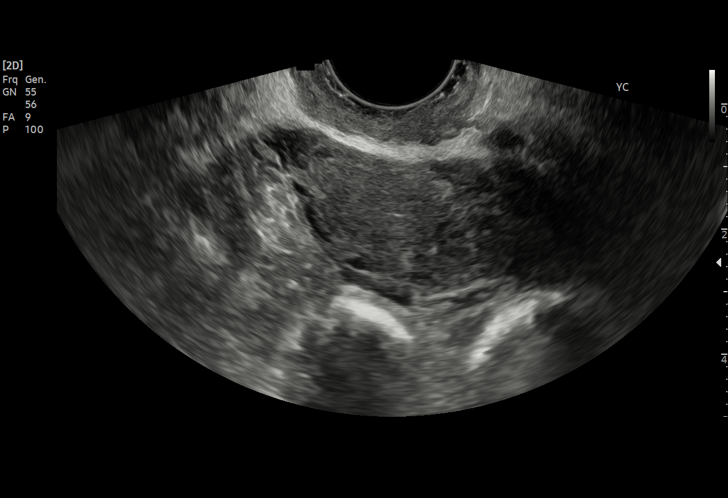
[im 32/64]
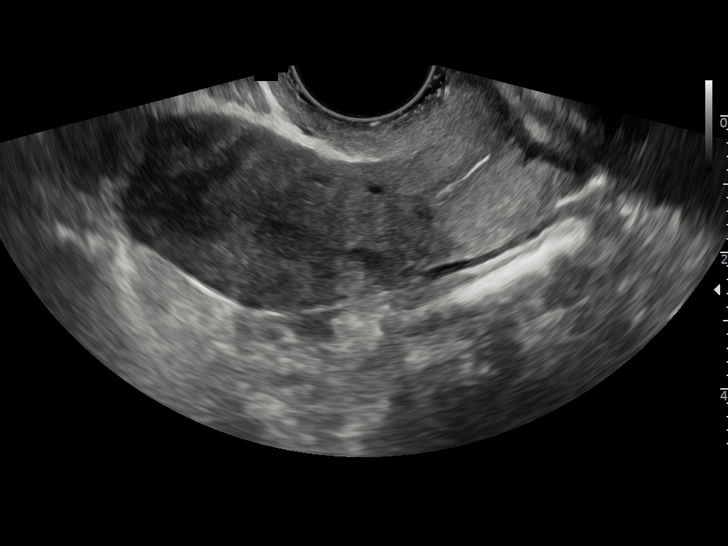
[im 37/64]
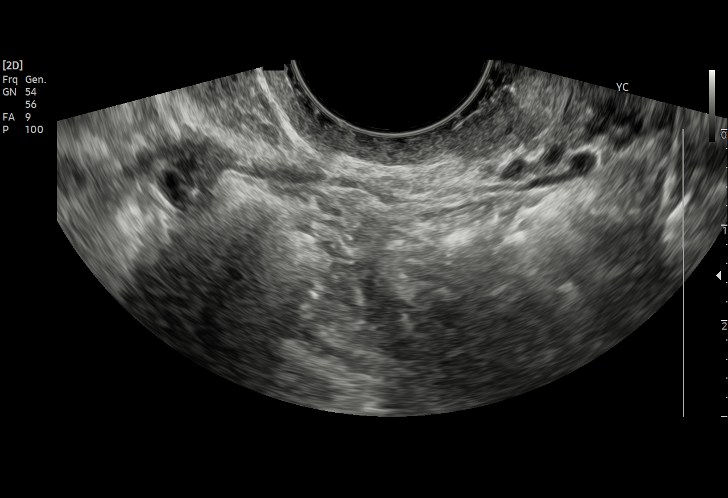
[im 40/64]
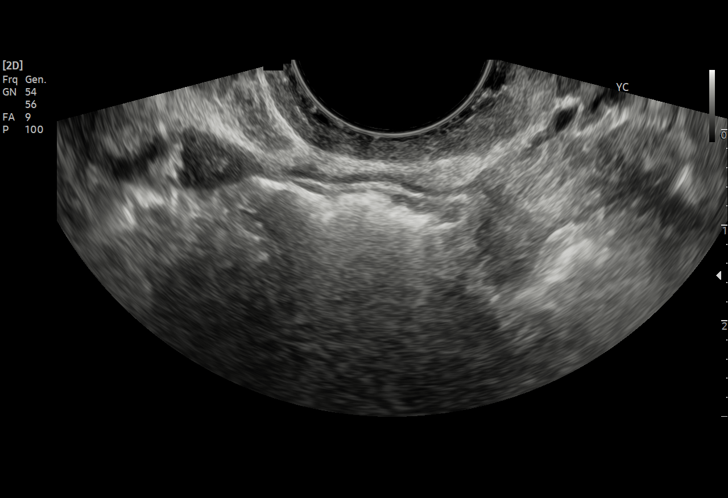
[im 45/64]
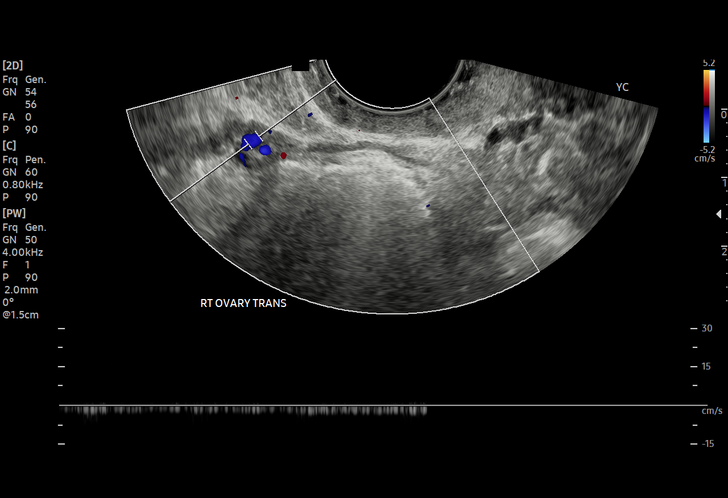
[im 50/64]
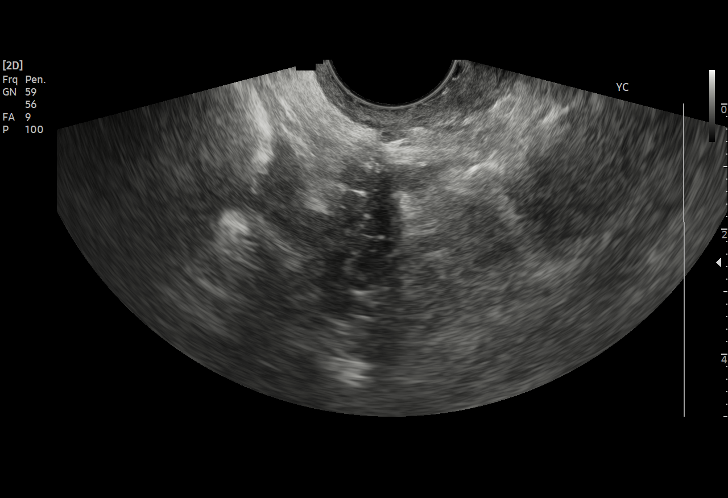
[im 53/64]
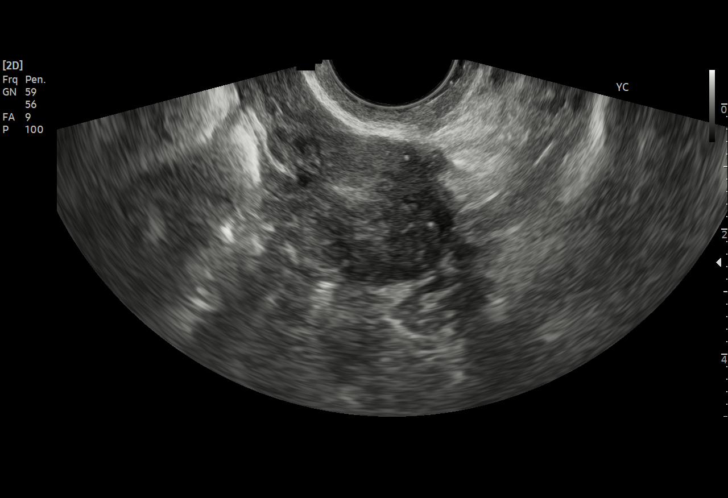
[im 58/64]
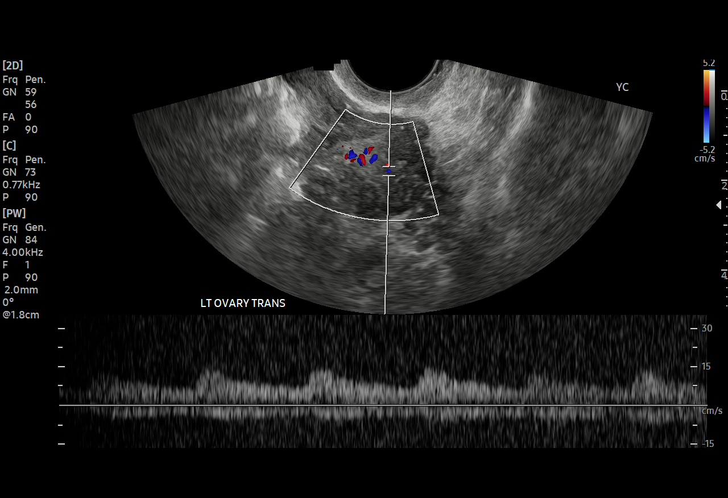
[im 64/64]
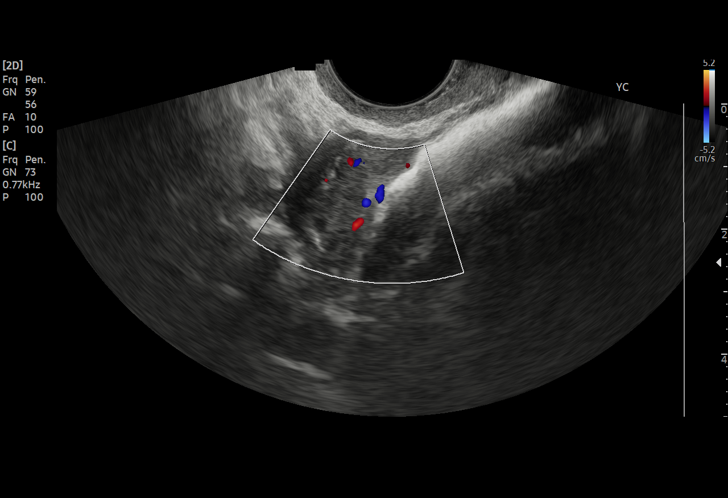

[15 of 25 positions shown; findings below may reference images not displayed]

FINDINGS: Uterus

Measurements: 6.0 x 2.7 x 4.3 cm = volume: 36 mL. The uterus is
anteverted. The cervix is unremarkable. A heterogeneously hypoechoic
mass is seen within the fundus measuring roughly 1.8 x 1.9 cm, best
seen on sagittal image # 34 most in keeping with a intramural
fibroid. No other intrauterine masses are seen.

Endometrium

Thickness: Less than 1 mm.  No focal abnormality visualized.

Right ovary

Measurements: 0.8 x 0.6 x 0.7 cm = volume: 1 mL. Normal
appearance/no adnexal mass.

Left ovary

Measurements: 2.3 x 1.6 x 2.0 cm = volume: 4 mL. Heterogeneous
echotexture noted, nonspecific. No discrete cystic or solid mass
identified.

Pulsed Doppler evaluation of both ovaries demonstrates normal
low-resistance arterial and venous waveforms.

Other findings

No abnormal free fluid.
IMPRESSION: Endometrial atrophy.

1.9 cm intramural fundal mass likely representing an intramural
fibroid.

## 2022-04-21 IMAGING — US US ART/VEN ABD/PELV/SCROTUM DOPPLER LTD
1 series · 15 of 25 positions shown · non-contrast
Comparison: None.

CLINICAL DATA: Postmenopausal bleeding

EXAM:
TRANSABDOMINAL AND TRANSVAGINAL ULTRASOUND OF PELVIS
DOPPLER ULTRASOUND OF OVARIES
TECHNIQUE: Both transabdominal and transvaginal ultrasound examinations of the
pelvis were performed. Transabdominal technique was performed for
global imaging of the pelvis including uterus, ovaries, adnexal
regions, and pelvic cul-de-sac.
It was necessary to proceed with endovaginal exam following the
transabdominal exam to visualize the endometriuml. Color and duplex
Doppler ultrasound was utilized to evaluate blood flow to the
ovaries.

[Series 1: us pelvis complete mc & wl · 64 acquisitions, 15 frames shown]
[im 1/64]
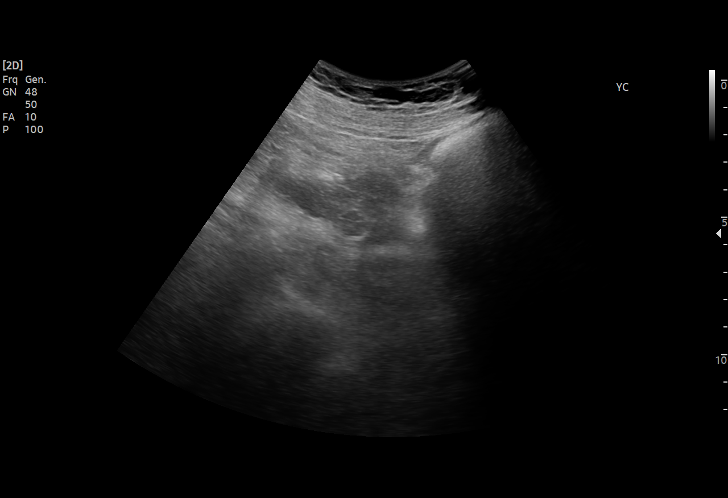
[im 6/64]
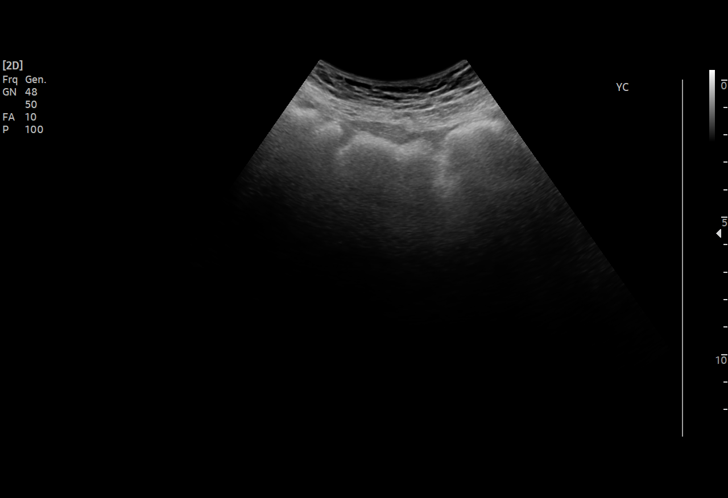
[im 11/64]
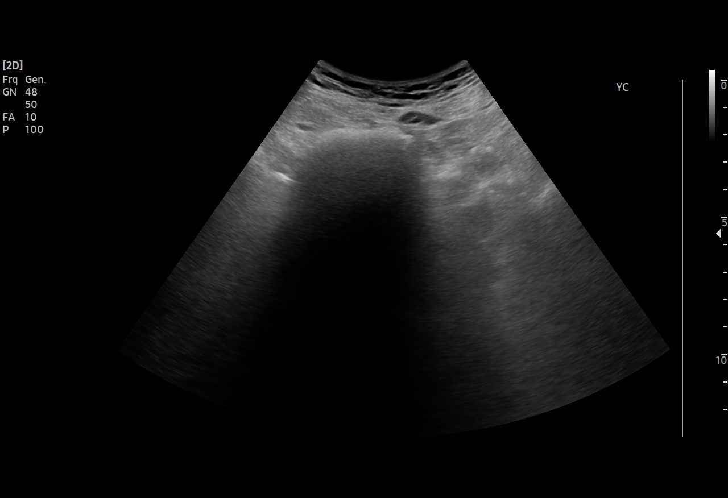
[im 14/64]
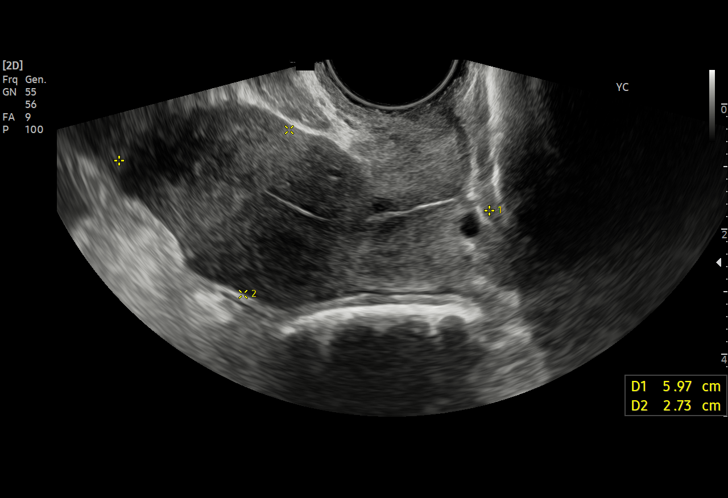
[im 19/64]
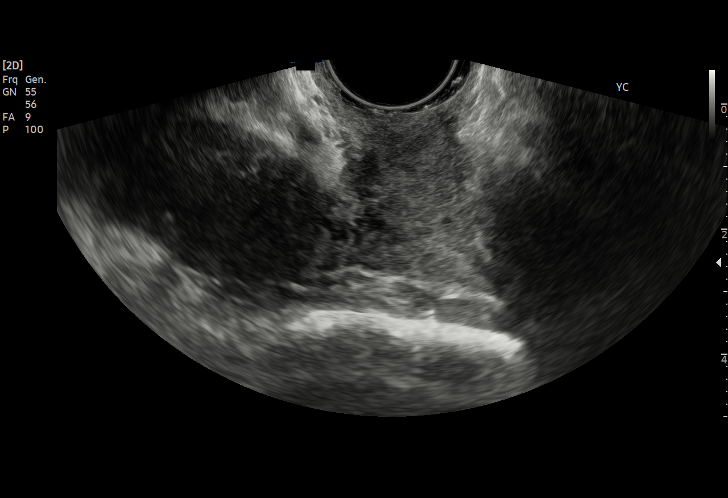
[im 24/64]
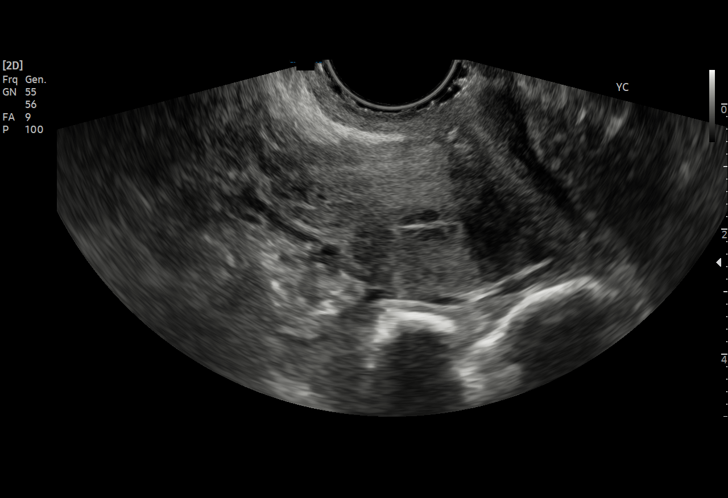
[im 27/64]
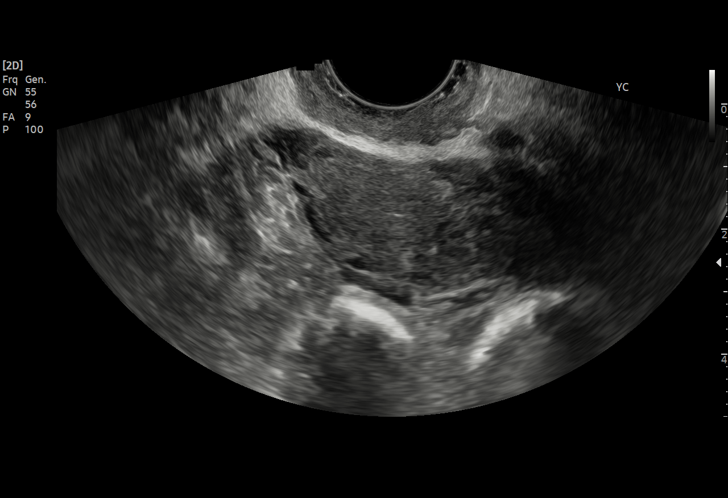
[im 32/64]
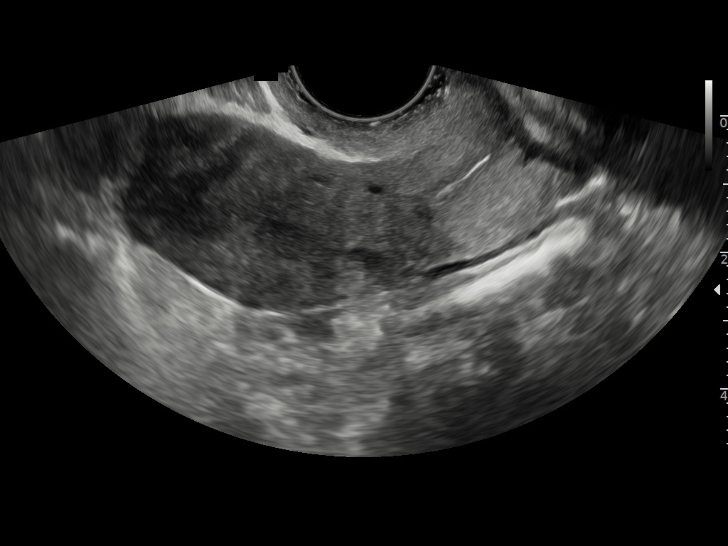
[im 37/64]
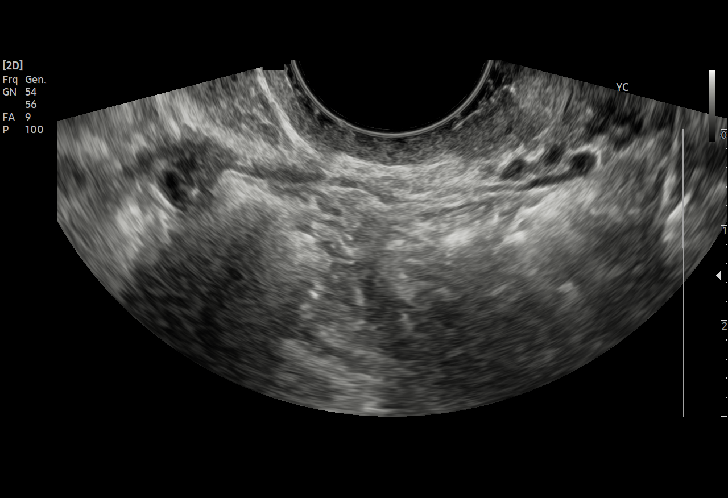
[im 40/64]
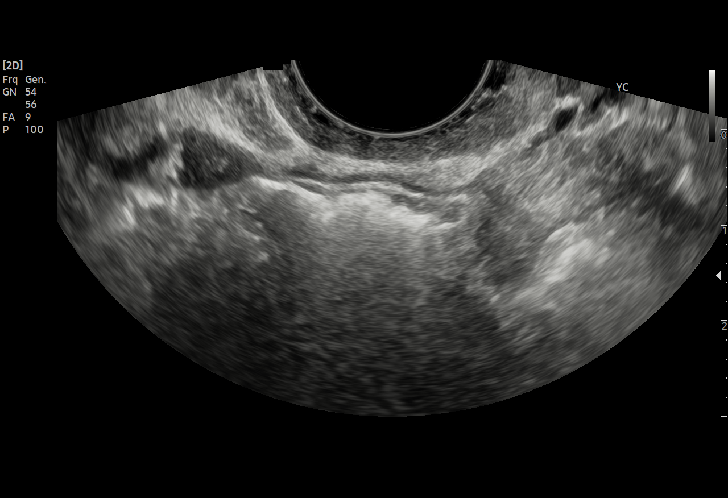
[im 45/64]
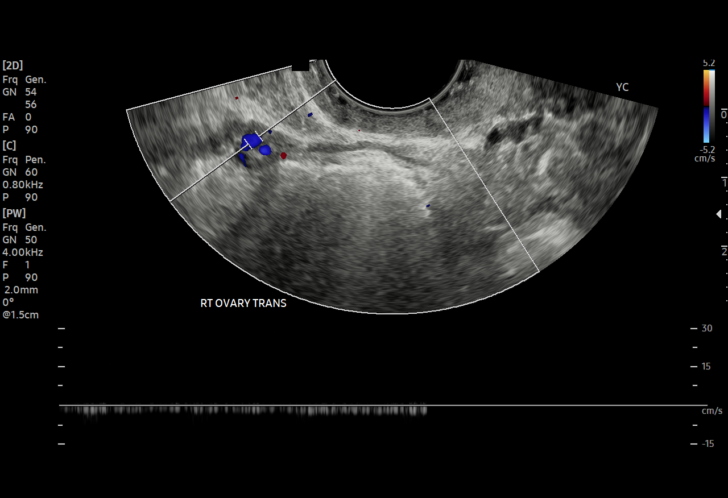
[im 50/64]
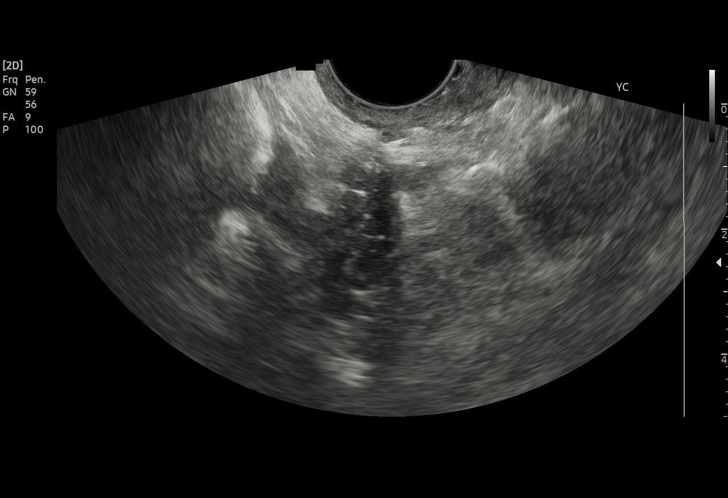
[im 53/64]
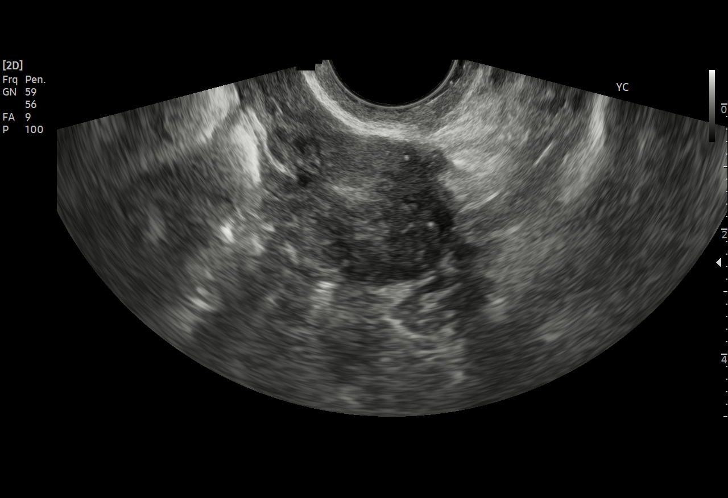
[im 58/64]
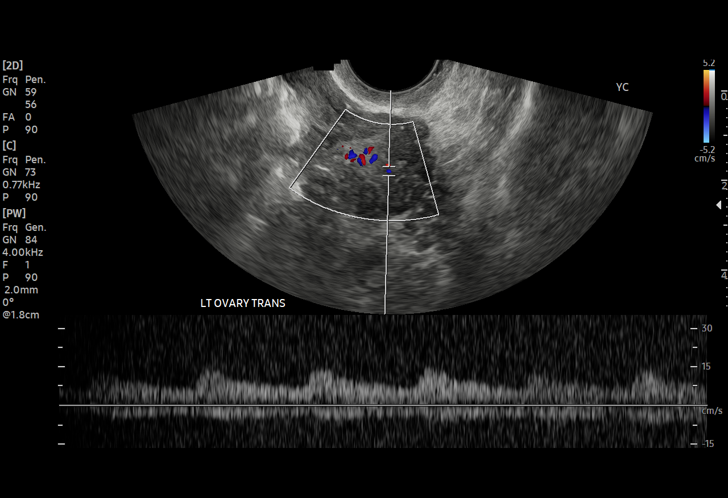
[im 64/64]
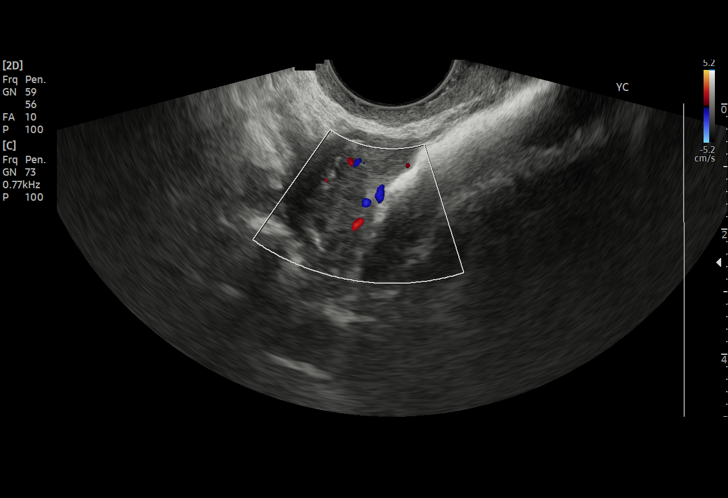

[15 of 25 positions shown; findings below may reference images not displayed]

FINDINGS: Uterus

Measurements: 6.0 x 2.7 x 4.3 cm = volume: 36 mL. The uterus is
anteverted. The cervix is unremarkable. A heterogeneously hypoechoic
mass is seen within the fundus measuring roughly 1.8 x 1.9 cm, best
seen on sagittal image # 34 most in keeping with a intramural
fibroid. No other intrauterine masses are seen.

Endometrium

Thickness: Less than 1 mm.  No focal abnormality visualized.

Right ovary

Measurements: 0.8 x 0.6 x 0.7 cm = volume: 1 mL. Normal
appearance/no adnexal mass.

Left ovary

Measurements: 2.3 x 1.6 x 2.0 cm = volume: 4 mL. Heterogeneous
echotexture noted, nonspecific. No discrete cystic or solid mass
identified.

Pulsed Doppler evaluation of both ovaries demonstrates normal
low-resistance arterial and venous waveforms.

Other findings

No abnormal free fluid.
IMPRESSION: Endometrial atrophy.

1.9 cm intramural fundal mass likely representing an intramural
fibroid.

## 2022-04-21 IMAGING — US US TRANSVAGINAL NON-OB
1 series · 15 of 25 positions shown · non-contrast
Comparison: None.

CLINICAL DATA: Postmenopausal bleeding

EXAM:
TRANSABDOMINAL AND TRANSVAGINAL ULTRASOUND OF PELVIS
DOPPLER ULTRASOUND OF OVARIES
TECHNIQUE: Both transabdominal and transvaginal ultrasound examinations of the
pelvis were performed. Transabdominal technique was performed for
global imaging of the pelvis including uterus, ovaries, adnexal
regions, and pelvic cul-de-sac.
It was necessary to proceed with endovaginal exam following the
transabdominal exam to visualize the endometriuml. Color and duplex
Doppler ultrasound was utilized to evaluate blood flow to the
ovaries.

[Series 1: us pelvis complete mc & wl · 64 acquisitions, 15 frames shown]
[im 1/64]
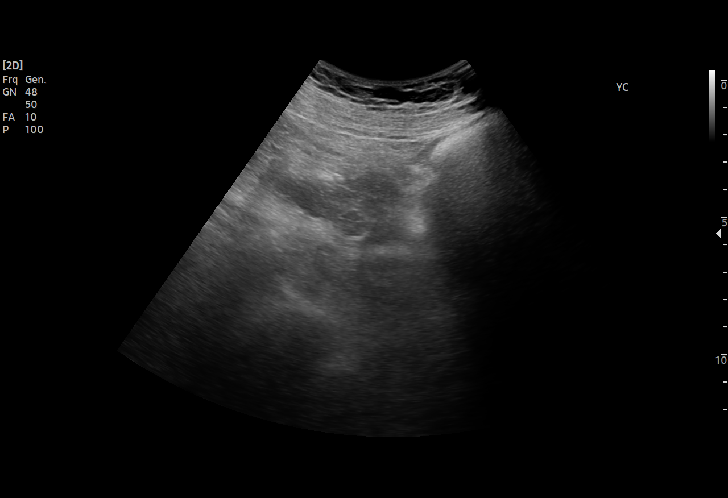
[im 6/64]
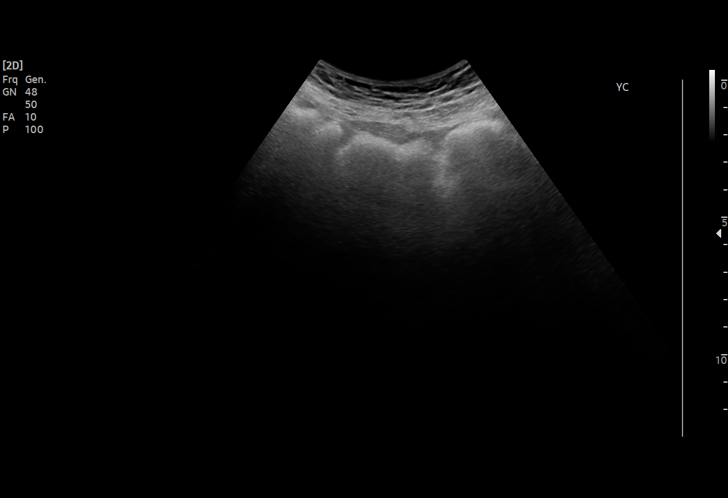
[im 11/64]
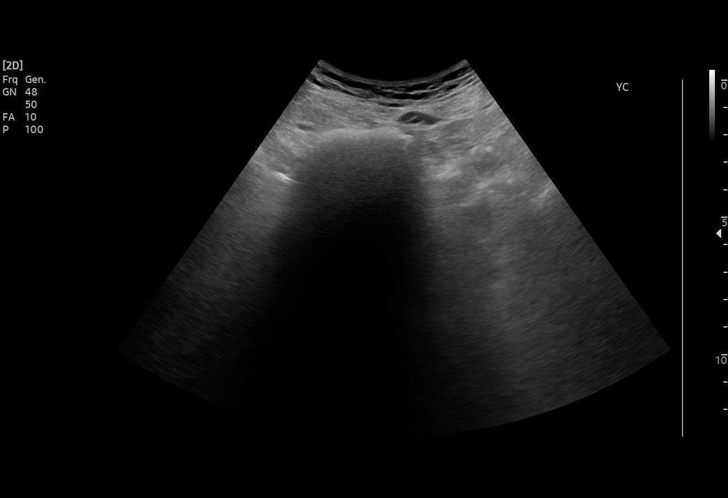
[im 14/64]
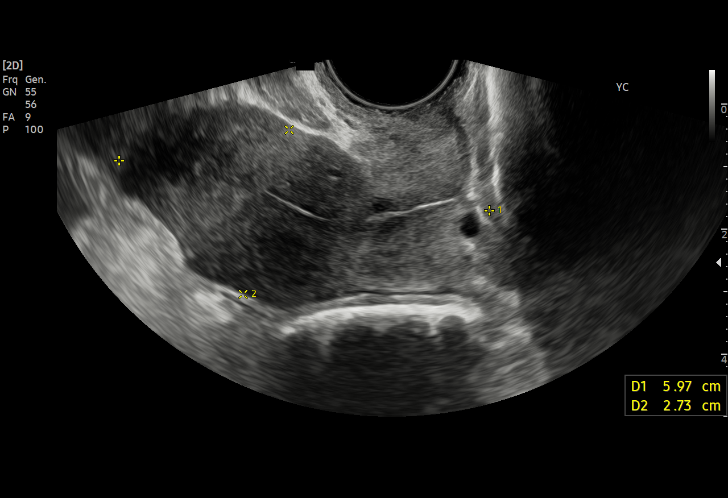
[im 19/64]
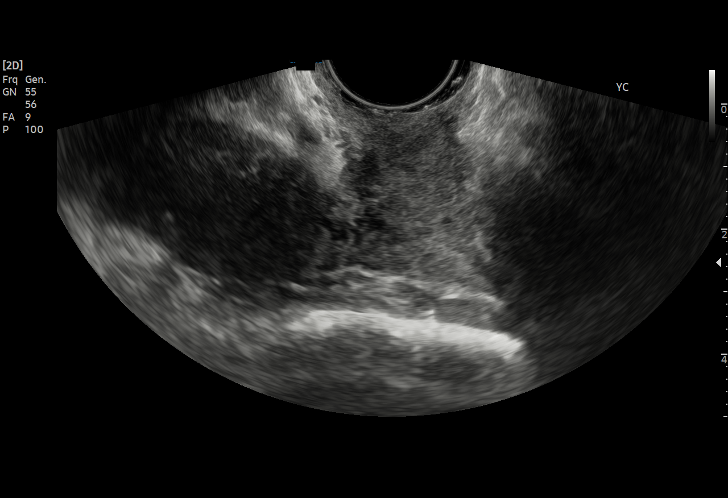
[im 24/64]
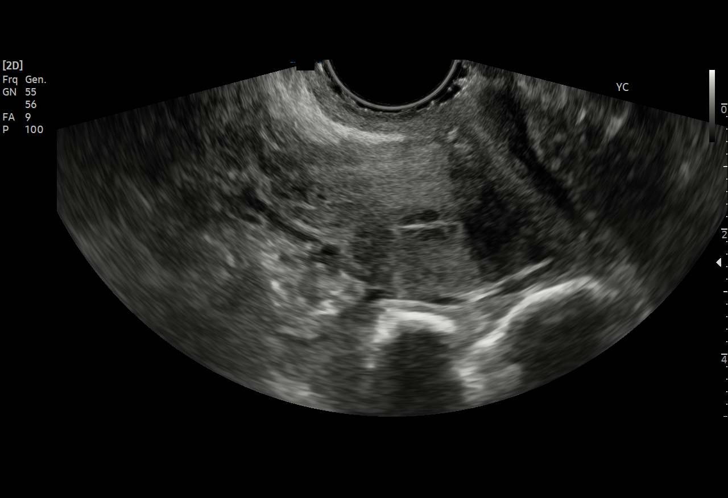
[im 27/64]
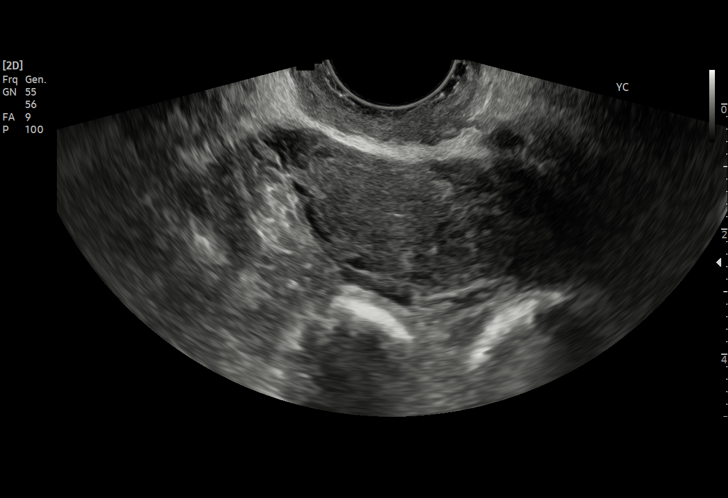
[im 32/64]
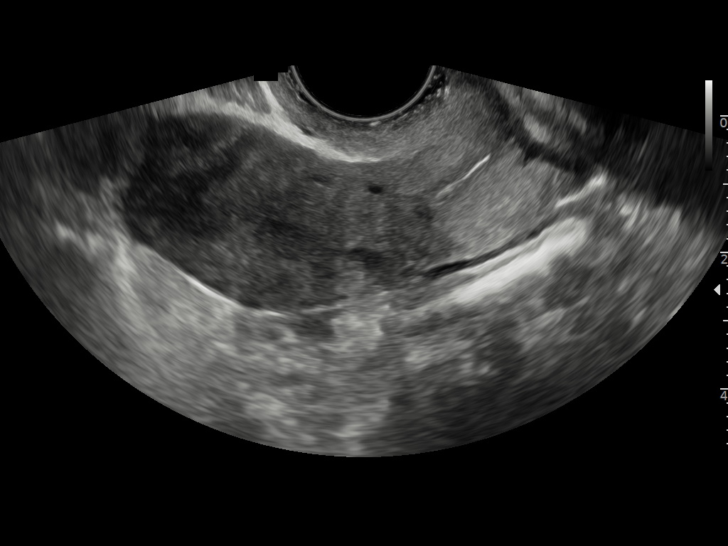
[im 37/64]
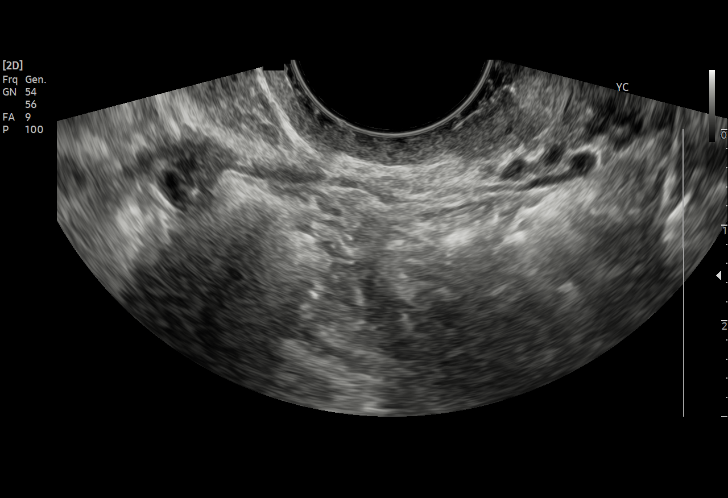
[im 40/64]
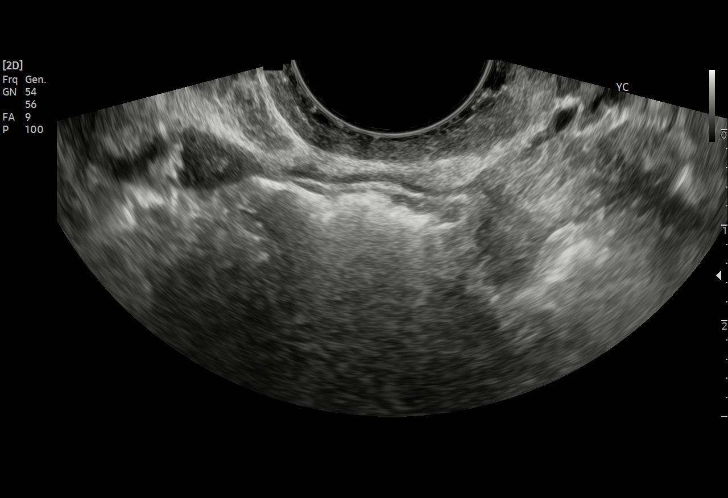
[im 45/64]
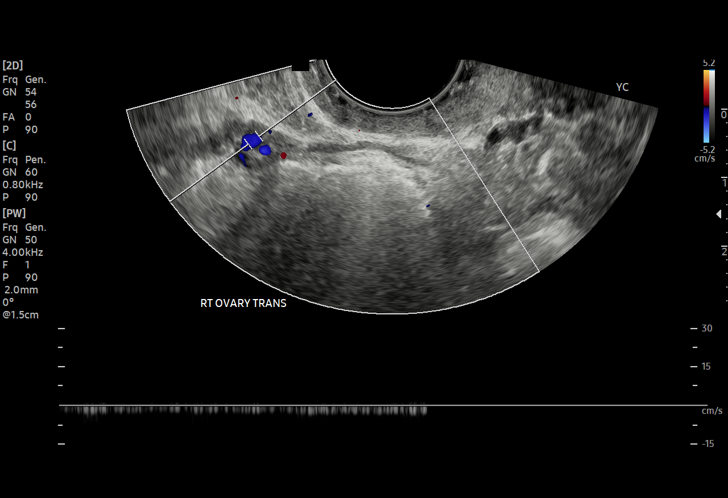
[im 50/64]
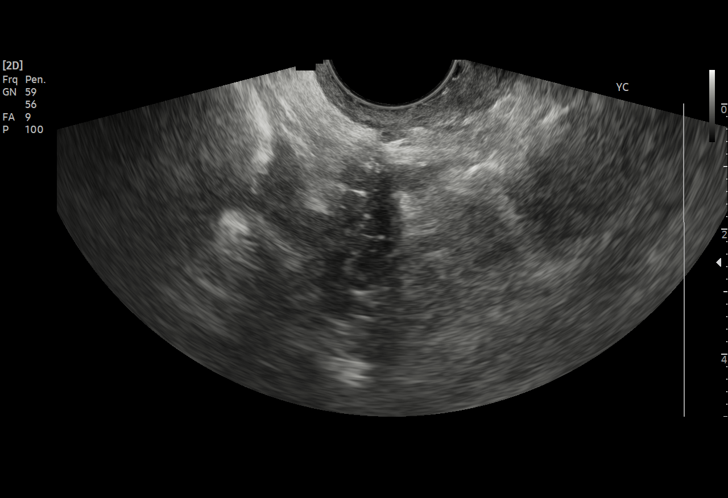
[im 53/64]
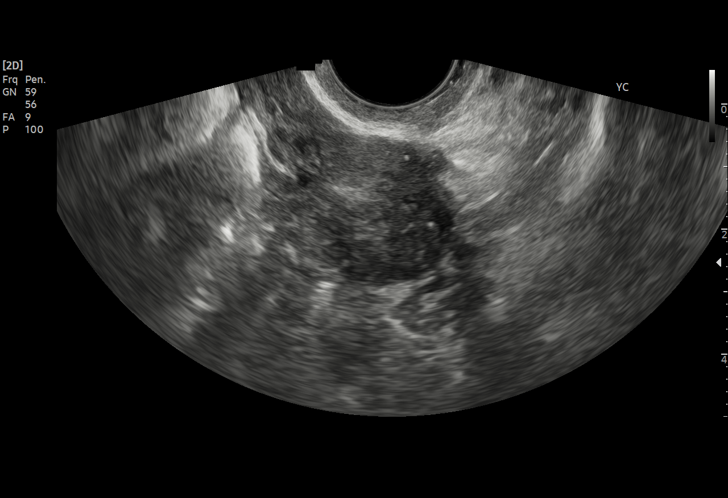
[im 58/64]
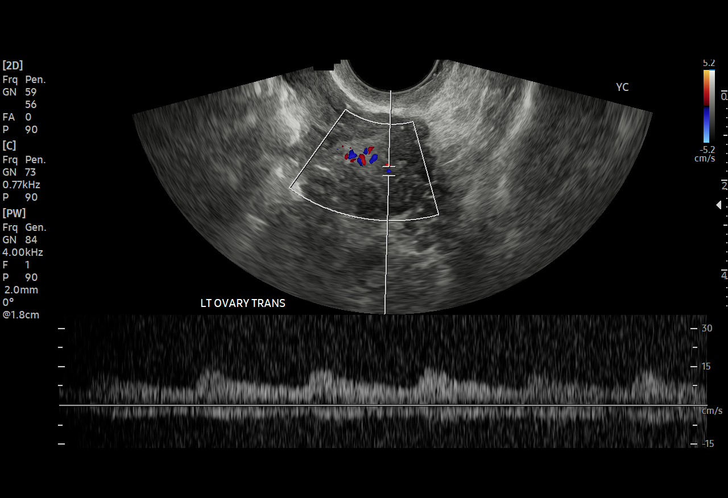
[im 64/64]
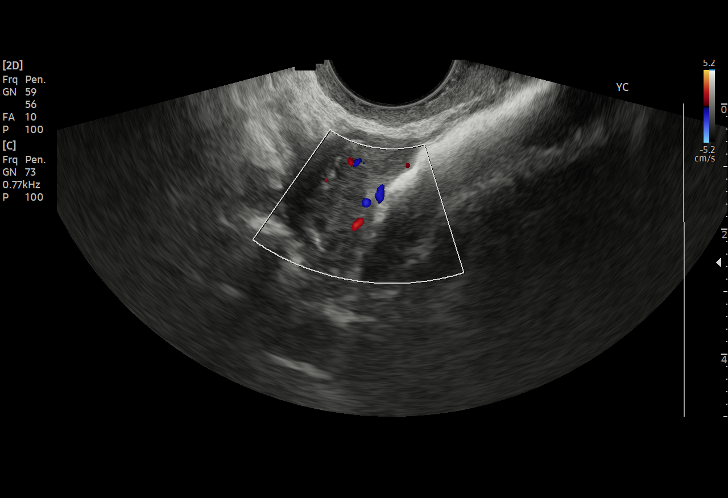

[15 of 25 positions shown; findings below may reference images not displayed]

FINDINGS: Uterus

Measurements: 6.0 x 2.7 x 4.3 cm = volume: 36 mL. The uterus is
anteverted. The cervix is unremarkable. A heterogeneously hypoechoic
mass is seen within the fundus measuring roughly 1.8 x 1.9 cm, best
seen on sagittal image # 34 most in keeping with a intramural
fibroid. No other intrauterine masses are seen.

Endometrium

Thickness: Less than 1 mm.  No focal abnormality visualized.

Right ovary

Measurements: 0.8 x 0.6 x 0.7 cm = volume: 1 mL. Normal
appearance/no adnexal mass.

Left ovary

Measurements: 2.3 x 1.6 x 2.0 cm = volume: 4 mL. Heterogeneous
echotexture noted, nonspecific. No discrete cystic or solid mass
identified.

Pulsed Doppler evaluation of both ovaries demonstrates normal
low-resistance arterial and venous waveforms.

Other findings

No abnormal free fluid.
IMPRESSION: Endometrial atrophy.

1.9 cm intramural fundal mass likely representing an intramural
fibroid.

## 2022-09-09 ENCOUNTER — Emergency Department (HOSPITAL_BASED_OUTPATIENT_CLINIC_OR_DEPARTMENT_OTHER)
Admission: EM | Admit: 2022-09-09 | Discharge: 2022-09-09 | Disposition: A | Discharge status: Home / Self Care | Payer: Self-pay | Attending: Emergency Medicine | Admitting: Emergency Medicine

## 2022-09-09 ENCOUNTER — Other Ambulatory Visit: Payer: Self-pay

## 2022-09-09 DIAGNOSIS — I4510 Unspecified right bundle-branch block: Secondary | ICD-10-CM | POA: Insufficient documentation

## 2022-09-09 DIAGNOSIS — R202 Paresthesia of skin: Secondary | ICD-10-CM | POA: Insufficient documentation

## 2022-09-09 NOTE — Discharge Instructions (Addendum)
We evaluated you for your abnormal EKG and tingling sensation.  Your EKG that we obtained here shows a right bundle branch block.  I do not think this is related to your tingling sensation.  I do not think that this signifies anything dangerous.  I do not think this means you need to follow-up with the cardiologist, but I would establish care with a primary physician.  Your tingling sensation could be due to a syndrome called carpal tunnel syndrome.  This is usually worse at night and can be worse if you use her hands a lot at work.  I would buy a wrist brace at the pharmacy and wear this especially at night.  You can take 600 mg of ibuprofen every 6 hours as well to see if this helps with your symptoms.  You can call [facility] to establish care with a primary doctor.  If your symptoms are persistent, you can schedule follow-up with [facility] neurology.  Please return to the emergency department if you develop any new symptoms such as chest pain, difficulty breathing, numbness or dizziness, fainting, facial droop, numbness, weakness, visual change, trouble walking, clumsiness in your hands, or any other concerning symptoms.

## 2022-09-09 NOTE — ED Provider Notes (Signed)
[place] [facility] Provider Note  CSN: [id number] Arrival date & time: 09/09/22 1542  Chief Complaint(s) Abnormal ECG  HPI Amber Pennington is a 62 y.o. female with no significant past medical history presenting to the emergency department with left arm tingling.  She reports that the tingling sensation starts at the wrist and radiates to her forearm and hand.  Denies any pain.  She denies numbness.  Denies any weakness.  Symptoms have been present for 3 days.  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  Denies similar episodes in the past.  No other tingling.  Reports this is worse at night.  She reports she went to an urgent care before this.  They told her that she had an abnormal EKG that she should come to the emergency department.  She denies chest pain, shortness of breath, diaphoresis, dizziness, lightheadedness, fainting, vomiting, or any other new symptoms.   Past Medical History No past medical history on file. Patient Active Problem List   Diagnosis Date Noted   PMB (postmenopausal bleeding) 01/30/2021   Home Medication(s) Prior to Admission medications   Medication Sig Start Date End Date Taking? Authorizing Provider  medroxyPROGESTERone (PROVERA) 10 MG tablet 1 tab po bid x 7 days 01/31/21   Hoyleton Bing, MD                                                                                                                                    Past Surgical History Past Surgical History:  Procedure Laterality Date   bone spur Right    ENDOMETRIAL BIOPSY  01/31/2021   WISDOM TOOTH EXTRACTION     Family History Family History  Problem Relation Age of Onset   Hodgkin's lymphoma Mother    Hypertension Father     Social History Social History   Tobacco Use   Smoking status: Never    Passive exposure: Never   Smokeless tobacco: Never  Vaping Use   Vaping status: Never Used  Substance Use Topics    Alcohol use: Yes    Alcohol/week: 0.0 standard drinks of alcohol    Comment: wine daily   Drug use: No   Allergies Patient has no known allergies.  Review of Systems Review of Systems  All other systems reviewed and are negative.   Physical Exam Vital Signs  I have reviewed the triage vital signs BP (!) 156/83 (BP Location: Left Arm)   Pulse 82   Temp 98.1 F (36.7 C) (Oral)   Resp 17   Ht 5\' 9"  (1.753 m)   Wt 76.2 kg   SpO2 100%   BMI 24.81 kg/m  Physical Exam Vitals and nursing note reviewed.  Constitutional:      General: She is not in acute distress.    Appearance: She is well-developed.  HENT:     Head: Normocephalic and atraumatic.  Mouth/Throat:     Mouth: Mucous membranes are moist.  Eyes:     Pupils: Pupils are equal, round, and reactive to light.  Cardiovascular:     Rate and Rhythm: Normal rate and regular rhythm.     Heart sounds: No murmur heard. Pulmonary:     Effort: Pulmonary effort is normal. No respiratory distress.     Breath sounds: Normal breath sounds.  Abdominal:     General: Abdomen is flat.     Palpations: Abdomen is soft.     Tenderness: There is no abdominal tenderness.  Musculoskeletal:        General: No tenderness.     Right lower leg: No edema.     Left lower leg: No edema.  Skin:    General: Skin is warm and dry.  Neurological:     General: No focal deficit present.     Mental Status: She is alert. Mental status is at baseline.     Comments: Cranial nerves II through XII intact, strength 5 out of 5 in the bilateral upper and lower extremities, no sensory deficit to light touch throughout including LUE/wrist/hand, no deficit to ulnar radial or median nerves, no dysmetria on finger-nose-finger testing, ambulatory with steady gait.  Psychiatric:        Mood and Affect: Mood normal.        Behavior: Behavior normal.     ED Results and Treatments Labs (all labs ordered are listed, but only abnormal results are  displayed) Labs Reviewed - No data to display                                                                                                                        Radiology No results found.  Pertinent labs & imaging results that were available during my care of the patient were reviewed by me and considered in my medical decision making (see MDM for details).  Medications Ordered in ED Medications - No data to display                                                                                                                                   Procedures Procedures  (including critical care time)  Medical Decision Making / ED Course   MDM:  62 year old presenting to the emergency department with left upper extremity tingling.  Patient overall very well-appearing, her neurologic exam is normal with no  sensory deficit.  Suspect the most likely cause of her symptoms are carpal tunnel syndrome given the location of her symptoms.  Her Tinel and Phalen tests are nonprovocative but sensitivity of these tests is variable.  She does have worse symptoms at night and works frequently with her hands.  Extremely low concern for stroke given normal neurologic exam with no objective deficit.  She denies any neck pain to suggest any proximal peripheral neuropathy.  With her reassuring exam, recommend trial of wrist brace, follow-up with neurology as needed.  Discussed need to return if patient develops any new neurologic symptoms such as weakness.   Patient also went to an urgent care and had EKG, findings of which are not available, but was reportedly told that it was not normal.  EKG here shows right bundle branch block with no acute ST changes.  She has no symptoms to suggest any cardiac cause such as chest pain, difficulty breathing.  I do not think that her tingling sensation is related to this finding.  Right bundle branch block is often a normal variant.  I do not think she needs further  testing in the emergency department for this.  Advised that she can [facility] with [facility] and she does not have a primary care physician.  Will discharge patient to home. All questions answered. Patient comfortable with plan of discharge. Return precautions discussed with patient and specified on the after visit summary.       Additional history obtained: -External records from outside source obtained and reviewed including: Chart review including previous notes, labs, imaging, consultation notes including prior ER visit   EKG   EKG Interpretation Date/Time:  Tuesday September 09 2022 15:58:24 EDT Ventricular Rate:  79 PR Interval:  144 QRS Duration:  161 QT Interval:  427 QTC Calculation: 490 R Axis:   88  Text Interpretation: Sinus rhythm Right bundle branch block Confirmed by Alvino Blood ([id number]) on 09/09/2022 4:19:44 PM        Co morbidities that complicate the patient evaluation No past medical history on file.    Dispostion: Disposition decision including need for hospitalization was considered, and patient discharged from emergency department.    Final Clinical Impression(s) / ED Diagnoses Final diagnoses:  Right bundle branch block  Tingling     This chart was dictated using voice recognition software.  Despite best efforts to proofread,  errors can occur which can change the documentation meaning.    Lonell Grandchild, MD 09/09/22 1700

## 2022-09-09 NOTE — ED Triage Notes (Signed)
Reports had some tingling sensation on left arm started 3 days ago,on and off in nature. States she went to a walk in clinic and was told to have an "abnormal EKG" with nothing to compare it to. Denies pain when asked. State the tingling sensation is felt in lower left side as well sometimes.

## 2024-01-08 ENCOUNTER — Other Ambulatory Visit: Payer: Self-pay

## 2024-01-08 ENCOUNTER — Emergency Department (HOSPITAL_BASED_OUTPATIENT_CLINIC_OR_DEPARTMENT_OTHER)
Admission: EM | Admit: 2024-01-08 | Discharge: 2024-01-08 | Disposition: A | Discharge status: Home / Self Care | Payer: Self-pay | Attending: Emergency Medicine | Admitting: Emergency Medicine

## 2024-01-08 ENCOUNTER — Emergency Department (HOSPITAL_BASED_OUTPATIENT_CLINIC_OR_DEPARTMENT_OTHER): Payer: Self-pay

## 2024-01-08 DIAGNOSIS — N950 Postmenopausal bleeding: Secondary | ICD-10-CM | POA: Insufficient documentation

## 2024-01-08 LAB — COMPREHENSIVE METABOLIC PANEL WITH GFR
ALT: 21 U/L (ref 0–44)
AST: 23 U/L (ref 15–41)
Albumin: 4.4 g/dL (ref 3.5–5.0)
Alkaline Phosphatase: 54 U/L (ref 38–126)
Anion gap: 10 (ref 5–15)
BUN: 9 mg/dL (ref 8–23)
CO2: 27 mmol/L (ref 22–32)
Calcium: 9.2 mg/dL (ref 8.9–10.3)
Chloride: 102 mmol/L (ref 98–111)
Creatinine, Ser: 0.67 mg/dL (ref 0.44–1.00)
GFR, Estimated: >60 mL/min (ref >60)
Glucose, Bld: 118 mg/dL — ABNORMAL HIGH (ref 70–99)
Potassium: 3.7 mmol/L (ref 3.5–5.1)
Sodium: 139 mmol/L (ref 135–145)
Total Bilirubin: 0.3 mg/dL (ref 0.0–1.2)
Total Protein: 6.9 g/dL (ref 6.5–8.1)

## 2024-01-08 LAB — CBC
HCT: 42.2 % (ref 36.0–46.0)
Hemoglobin: 14.2 g/dL (ref 12.0–15.0)
MCH: 29.7 pg (ref 26.0–34.0)
MCHC: 33.6 g/dL (ref 30.0–36.0)
MCV: 88.3 fL (ref 80.0–100.0)
Platelets: 329 K/uL (ref 150–400)
RBC: 4.78 MIL/uL (ref 3.87–5.11)
RDW: 12.6 % (ref 11.5–15.5)
WBC: 6.4 K/uL (ref 4.0–10.5)
nRBC: 0 % (ref 0.0–0.2)

## 2024-01-08 NOTE — Discharge Instructions (Signed)
 Your laboratory studies are within normal limits today.  We discussed results of your ultrasound, you will need to schedule an appointment with your OB/GYN in order to obtain further options of managing your vaginal bleeding.  If you experience any dizziness, lightheaded you will need to return to the emergency department.

## 2024-01-08 NOTE — ED Provider Notes (Signed)
 [place] [facility] Provider Note   CSN: [id number] Arrival date & time: 01/08/24  1412     Patient presents with: Vaginal Bleeding   Amber Pennington is a 63 y.o. female.   63 y.o female with a PMH of post menopausal bleeding presents to the ED with a chief complaint of vaginal bleeding x 2 days ago. Patient reports extensive heavy bleeding which has been constant. She has been wearing double pads and changing them every hour. She did sleep with a depends and had to get up in the middle of the night. She did have an episode like this 3 years ago where she saw Dr. Izell SHIPPER. She reports this episode is more mild than the one 3 years ago. She had a pap smear then and a biopsy of her fibroid then which were negative for malignancy. She has not prior hx of anemia, no prior transfusion. No urinary symptoms wear a pessary due to grade 4 level prolapse. Not on any blood thinners.    Vaginal bleeding began 2 days ago Double pads every hour Wore depends last night and kept bleeidng all day even with seating  No anemia  3 years ago, negative pap smear with US   This feels better than last time   The history is provided by the patient.  Vaginal Bleeding Quality:  Clots Severity:  Moderate Onset quality:  Gradual Duration:  3 days Timing:  Intermittent Progression:  Worsening Chronicity:  Recurrent Relieved by:  Nothing Worsened by:  Nothing Associated symptoms: no abdominal pain, no dizziness and no fever        Prior to Admission medications   Medication Sig Start Date End Date Taking? Authorizing Provider  medroxyPROGESTERone  (PROVERA ) 10 MG tablet 1 tab po bid x 7 days 01/31/21   Izell Harari, MD    Allergies: Patient has no known allergies.    Review of Systems  Constitutional:  Negative for fever.  Respiratory:  Negative for shortness of breath.   Cardiovascular:  Negative for chest pain.  Gastrointestinal:  Negative for  abdominal pain.  Genitourinary:  Positive for vaginal bleeding.  Neurological:  Negative for dizziness and light-headedness.  All other systems reviewed and are negative.   Updated Vital Signs BP (!) 164/94   Pulse 78   Temp 97.7 F (36.5 C) (Oral)   Resp 20   SpO2 100%   Physical Exam Vitals and nursing note reviewed.  Constitutional:      Appearance: Normal appearance.  HENT:     Head: Normocephalic and atraumatic.     Mouth/Throat:     Mouth: Mucous membranes are moist.  Cardiovascular:     Rate and Rhythm: Normal rate.  Pulmonary:     Effort: Pulmonary effort is normal.  Abdominal:     General: Abdomen is flat.  Musculoskeletal:     Cervical back: Normal range of motion and neck supple.  Skin:    General: Skin is warm and dry.  Neurological:     Mental Status: She is alert and oriented to person, place, and time.     (all labs ordered are listed, but only abnormal results are displayed) Labs Reviewed  COMPREHENSIVE METABOLIC PANEL WITH GFR - Abnormal; Notable for the following components:      Result Value   Glucose, Bld 118 (*)    All other components within normal limits  CBC    EKG: None  Radiology: US  PELVIC COMPLETE WITH TRANSVAGINAL Result Date: 01/08/2024  CLINICAL DATA:  [id number] Post-menopausal bleeding [id number] EXAM: TRANSABDOMINAL AND TRANSVAGINAL ULTRASOUND OF PELVIS TECHNIQUE: Both transabdominal and transvaginal ultrasound examinations of the pelvis were performed. Transabdominal technique was performed for global imaging of the pelvis including uterus, ovaries, adnexal regions, and pelvic cul-de-sac. It was necessary to proceed with endovaginal exam following the transabdominal exam to visualize the uterus, endometrium, and ovaries. COMPARISON:  January 29, 2021 FINDINGS: Uterus Measurements: 5.5 x 2.1 x 3.3 cm = volume: 20.2 mL. Heterogeneous myometrium. Hypodense area in the uterine fundus measuring 2.1 x 1.2 x 1 cm, corresponding to a uterine  fibroid on the prior ultrasound, is unchanged. Smaller hypodensity in the lower uterine segment measuring 5 x 3 x 5 mm, also likely a small fibroid. Endometrium Thickness: Homogeneously thin, echogenic stripe measuring 1 mm. Mm. No focal abnormality visualized. Right ovary Measurements: 2.1 x 1.3 x 1 cm = volume: 1.5 mL. Normal appearance/no adnexal mass. Single follicle present measuring 5 mm. Microcystic change versus punctate calcifications noted. Left ovary Measurements: 2.4 x 1.5 x 1.5 cm = volume: 2.8 mL. Normal appearance/no adnexal mass. Microcystic change versus punctate calcifications noted. Other findings No free pelvic fluid. IMPRESSION: 1. Similar appearance of the atrophic endometrium. Otherwise, normal appearance of the uterus and ovaries for patient's age. 2. Uterine fibroids, as described above. Electronically Signed   By: Rogelia Myers M.D.   On: 01/08/2024 16:17     Procedures   Medications Ordered in the ED - No data to display                                  Medical Decision Making Amount and/or Complexity of Data Reviewed Labs: ordered. Radiology: ordered.   This patient presents to the ED for concern of vaginal bleeding, this involves a number of treatment options, and is a complaint that carries with it a high risk of complications and morbidity.  The differential diagnosis includes ABU bleeding, malignancy versus fibroids.   Co morbidities: Discussed in HPI   Brief History:  See HPI  EMR reviewed including pt PMHx, past surgical history and past visits to ER.   See HPI for more details   Lab Tests:  I ordered and independently interpreted labs.  The pertinent results include:    I personally reviewed all laboratory work and imaging. Metabolic panel without any acute abnormality specifically kidney function within normal limits and no significant electrolyte abnormalities. CBC without leukocytosis or significant anemia.   Imaging Studies:  Pelvic  ultrasound: IMPRESSION:  1. Similar appearance of the atrophic endometrium. Otherwise, normal  appearance of the uterus and ovaries for patient's age.  2. Uterine fibroids, as described above.    Medicines ordered:  N/A   Consults:  I requested consultation with Dr. Garen,  and discussed lab and imaging findings as well as pertinent plan - they recommend: reassurance along with close follow up with OBGYN.   Reevaluation:  After the interventions noted above I re-evaluated patient and found that they have :stayed the same   Social Determinants of Health:  Lack of healthcare insurance.   Problem List / ED Course:  Patient presented to the ED with a chief complaint of past 3 days, she tells me that had a similar episode approximately 3 years ago.  She was put then Provera , according to records she did get evaluated by Dr. Izell, they did prescribe her this medication.  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  Therefore there is a lot of risks of starting patient on this medication.  She reports bleeding approximately 2 pads an hour, has never had a transfusion, does not have any prior history of anemia and does not feel lightheaded on today's visit. On evaluation abdomen is pubic area.  No urinary send stable.  CBC with normal this.  CMP with no leg.  An ultrasound was obtained which did show some fibroids, along with some atrophic endometrium.  I did discuss these results with Dr. Garen OB/GYN on-call, we did discuss risk of placing patient back on Provera  at this time, we do not feel this will outweigh effects.  She does recommend reassurance along with close follow-up with OB/GYN. Patient tells me that she tried to get an appointment with OB/GYN however they told her that they would be not until February, therefore was given multiple referrals for different OB/GYN offices.  She is currently not on any blood thinners, I do feel that the bleeding will likely  subside but she was given return precautions in case there is any lightheadedness, dizziness, signs of losing a lot of blood.  She is agreeable to plan and treatment at this time, return precautions discussed at length.  Patient is hemodynamically stable for discharge.  Dispostion:  After consideration of the diagnostic results and the patients response to treatment, I feel that the patent would benefit from close follow-up with OB/GYN, return precautions provided at length.    Portions of this note were generated with Scientist, clinical (histocompatibility and immunogenetics). Dictation errors may occur despite best attempts at proofreading.   Final diagnoses:  Postmenopausal bleeding    ED Discharge Orders     None          Nico Rogness, PA-C 01/08/24 1659    Patsey Lot, MD 01/09/24 [phone]

## 2024-01-08 NOTE — ED Triage Notes (Signed)
 Pt reports heavy vaginal bleeding. Denies cp/sob/dizziness/lightheadedness  Reports had the same about 3 year ago.

## 2024-02-17 ENCOUNTER — Ambulatory Visit: Payer: Self-pay | Admitting: Obstetrics and Gynecology

## 2024-03-17 NOTE — Progress Notes (Unsigned)
" ° °  GYNECOLOGY OFFICE VISIT NOTE  History:  Amber Pennington is a 64 y.o. [phone] here today for PMB.   ***.   She has a h/o EMB in 2022. This was benign.   She had an US  in 2022:  Uterus   Measurements: 5.5 x 2.1 x 3.3 cm = volume: 20.2 mL. Heterogeneous myometrium. Hypodense area in the uterine fundus measuring 2.1 x 1.2 x 1 cm, corresponding to a uterine fibroid on the prior ultrasound, is unchanged. Smaller hypodensity in the lower uterine segment measuring 5 x 3 x 5 mm, also likely a small fibroid.   Endometrium   Thickness: Homogeneously thin, echogenic stripe measuring 1 mm. Mm. No focal abnormality visualized.   No past medical history on file.  Past Surgical History:  Procedure Laterality Date   bone spur Right    ENDOMETRIAL BIOPSY  01/31/2021   WISDOM TOOTH EXTRACTION      The following portions of the patient's history were reviewed and updated as appropriate: allergies, current medications, past family history, past medical history, past social history, past surgical history and problem list.   Health Maintenance:   Normal pap and negative HRHPV:   Diagnosis  Date Value Ref Range Status  01/31/2021   Final   - Negative for intraepithelial lesion or malignancy (NILM)     Normal mammogram on ***.   Review of Systems:  Pertinent items noted in HPI and remainder of comprehensive ROS otherwise negative.  Physical Exam:  There were no vitals taken for this visit. CONSTITUTIONAL: Well-developed, well-nourished female in no acute distress.  HEENT:  Normocephalic, atraumatic. External right and left ear normal. No scleral icterus.  NECK: Normal range of motion, supple, no masses noted on observation SKIN: No rash noted. Not diaphoretic. No erythema. No pallor. MUSCULOSKELETAL: Normal range of motion. No edema noted. NEUROLOGIC: Alert and oriented to person, place, and time. Normal muscle tone coordination. No cranial nerve deficit noted. PSYCHIATRIC:  Normal mood and affect. Normal behavior. Normal judgment and thought content.  PELVIC: {Blank single:19197::Deferred,Normal appearing external genitalia; normal urethral meatus; normal appearing vaginal mucosa and cervix.  No abnormal discharge noted.  Normal uterine size, no other palpable masses, no uterine or adnexal tenderness. Performed in the presence of a chaperone}  Labs and Imaging No results found for this or any previous visit (from the past week). No results found.  Assessment and Plan:  1. PMB (postmenopausal bleeding) (Primary) Reviewed options for PMB: EMB, D&C/Hysteroscopy in the OR, and US .  Reviewed if the above workup is negative, then due to atrophy. If persistent and above workup is negative, some women choose to have hysterectomy. ***    Diagnoses and all orders for this visit:  PMB (postmenopausal bleeding)     No orders of the defined types were placed in this encounter.    Routine preventative health maintenance measures emphasized. Please refer to After Visit Summary for other counseling recommendations.   No follow-ups on file.  Vina Solian, MD, FACOG Obstetrician & Gynecologist, [facility], [facility]      "

## 2024-03-21 ENCOUNTER — Encounter: Payer: Self-pay | Admitting: Obstetrics and Gynecology

## 2024-03-21 DIAGNOSIS — N950 Postmenopausal bleeding: Secondary | ICD-10-CM
